# Patient Record
Sex: Male | Born: 1945 | Race: White | Hispanic: No | Marital: Married | Smoking: Never smoker
Health system: Southern US, Community
[De-identification: ages and names within clinical notes are randomized; demographics above are authoritative.]

## PROBLEM LIST (undated history)

## (undated) DIAGNOSIS — C801 Malignant (primary) neoplasm, unspecified: Secondary | ICD-10-CM

## (undated) DIAGNOSIS — E7800 Pure hypercholesterolemia, unspecified: Secondary | ICD-10-CM

## (undated) DIAGNOSIS — M4306 Spondylolysis, lumbar region: Secondary | ICD-10-CM

## (undated) DIAGNOSIS — R0602 Shortness of breath: Secondary | ICD-10-CM

## (undated) DIAGNOSIS — I517 Cardiomegaly: Secondary | ICD-10-CM

## (undated) HISTORY — DX: Cardiomegaly: I51.7

## (undated) HISTORY — DX: Spondylolysis, lumbar region: M43.06

## (undated) HISTORY — PX: OTHER SURGICAL HISTORY: SHX169

## (undated) HISTORY — DX: Pure hypercholesterolemia, unspecified: E78.00

---

## 2000-10-13 ENCOUNTER — Encounter: Payer: Self-pay | Admitting: Internal Medicine

## 2000-10-13 ENCOUNTER — Encounter: Admission: RE | Admit: 2000-10-13 | Discharge: 2000-10-13 | Payer: Self-pay | Admitting: Internal Medicine

## 2005-06-21 ENCOUNTER — Ambulatory Visit: Payer: Self-pay | Admitting: Internal Medicine

## 2005-07-02 ENCOUNTER — Ambulatory Visit: Payer: Self-pay | Admitting: Internal Medicine

## 2006-05-16 ENCOUNTER — Encounter: Payer: Self-pay | Admitting: Cardiology

## 2009-09-22 ENCOUNTER — Encounter: Admission: RE | Admit: 2009-09-22 | Discharge: 2009-09-22 | Payer: Self-pay | Admitting: Internal Medicine

## 2009-09-26 ENCOUNTER — Ambulatory Visit: Payer: Self-pay | Admitting: Oncology

## 2009-10-01 ENCOUNTER — Ambulatory Visit (HOSPITAL_COMMUNITY): Admission: RE | Admit: 2009-10-01 | Discharge: 2009-10-01 | Payer: Self-pay | Admitting: Oncology

## 2009-10-01 LAB — CBC WITH DIFFERENTIAL/PLATELET
BASO%: 0.5 % (ref 0.0–2.0)
Eosinophils Absolute: 0.1 10*3/uL (ref 0.0–0.5)
MCHC: 34.2 g/dL (ref 32.0–36.0)
MCV: 96.5 fL (ref 79.3–98.0)
MONO%: 7.2 % (ref 0.0–14.0)
NEUT#: 4.7 10*3/uL (ref 1.5–6.5)
RBC: 4.87 10*6/uL (ref 4.20–5.82)
RDW: 13.3 % (ref 11.0–14.6)
WBC: 7.3 10*3/uL (ref 4.0–10.3)

## 2009-10-03 LAB — SPEP & IFE WITH QIG
Albumin ELP: 58.8 % (ref 55.8–66.1)
Alpha-1-Globulin: 4.2 % (ref 2.9–4.9)
IgA: 161 mg/dL (ref 68–378)
IgM, Serum: 70 mg/dL (ref 60–263)
Total Protein, Serum Electrophoresis: 7 g/dL (ref 6.0–8.3)

## 2009-10-03 LAB — COMPREHENSIVE METABOLIC PANEL
ALT: 21 U/L (ref 0–53)
AST: 21 U/L (ref 0–37)
Albumin: 4.4 g/dL (ref 3.5–5.2)
Alkaline Phosphatase: 130 U/L — ABNORMAL HIGH (ref 39–117)
Glucose, Bld: 101 mg/dL — ABNORMAL HIGH (ref 70–99)
Potassium: 5.1 mEq/L (ref 3.5–5.3)
Sodium: 138 mEq/L (ref 135–145)
Total Bilirubin: 0.4 mg/dL (ref 0.3–1.2)
Total Protein: 7 g/dL (ref 6.0–8.3)

## 2009-10-17 ENCOUNTER — Ambulatory Visit (HOSPITAL_COMMUNITY): Admission: RE | Admit: 2009-10-17 | Discharge: 2009-10-17 | Payer: Self-pay | Admitting: Oncology

## 2009-10-30 ENCOUNTER — Ambulatory Visit (HOSPITAL_COMMUNITY): Admission: RE | Admit: 2009-10-30 | Discharge: 2009-10-30 | Payer: Self-pay | Admitting: Oncology

## 2009-10-31 HISTORY — PX: PORTACATH PLACEMENT: SHX2246

## 2009-11-12 ENCOUNTER — Ambulatory Visit: Payer: Self-pay | Admitting: Hematology & Oncology

## 2009-11-27 ENCOUNTER — Ambulatory Visit (HOSPITAL_COMMUNITY): Admission: RE | Admit: 2009-11-27 | Discharge: 2009-11-27 | Payer: Self-pay | Admitting: Hematology & Oncology

## 2009-12-10 LAB — CBC WITH DIFFERENTIAL (CANCER CENTER ONLY)
BASO%: 1.4 % (ref 0.0–2.0)
EOS%: 2.8 % (ref 0.0–7.0)
HGB: 15.4 g/dL (ref 13.0–17.1)
LYMPH#: 2.1 10*3/uL (ref 0.9–3.3)
MCHC: 34 g/dL (ref 32.0–35.9)
NEUT#: 0.7 10*3/uL — ABNORMAL LOW (ref 1.5–6.5)
RDW: 10.8 % (ref 10.5–14.6)

## 2009-12-17 ENCOUNTER — Ambulatory Visit: Payer: Self-pay | Admitting: Hematology & Oncology

## 2009-12-18 LAB — CBC WITH DIFFERENTIAL (CANCER CENTER ONLY)
BASO#: 0.1 10*3/uL (ref 0.0–0.2)
Eosinophils Absolute: 0.1 10*3/uL (ref 0.0–0.5)
HGB: 15.2 g/dL (ref 13.0–17.1)
LYMPH#: 2.3 10*3/uL (ref 0.9–3.3)
NEUT#: 2.6 10*3/uL (ref 1.5–6.5)
RBC: 4.58 10*6/uL (ref 4.20–5.70)
WBC: 5.6 10*3/uL (ref 4.0–10.0)

## 2009-12-18 LAB — COMPREHENSIVE METABOLIC PANEL
AST: 23 U/L (ref 0–37)
Albumin: 4 g/dL (ref 3.5–5.2)
BUN: 13 mg/dL (ref 6–23)
Calcium: 8.9 mg/dL (ref 8.4–10.5)
Chloride: 102 mEq/L (ref 96–112)
Glucose, Bld: 187 mg/dL — ABNORMAL HIGH (ref 70–99)
Potassium: 4.5 mEq/L (ref 3.5–5.3)
Total Protein: 6.5 g/dL (ref 6.0–8.3)

## 2010-01-06 ENCOUNTER — Ambulatory Visit (HOSPITAL_COMMUNITY): Admission: RE | Admit: 2010-01-06 | Discharge: 2010-01-06 | Payer: Self-pay | Admitting: Hematology & Oncology

## 2010-01-08 LAB — CBC WITH DIFFERENTIAL (CANCER CENTER ONLY)
BASO#: 0 10*3/uL (ref 0.0–0.2)
EOS%: 1.2 % (ref 0.0–7.0)
HCT: 41.3 % (ref 38.7–49.9)
HGB: 14 g/dL (ref 13.0–17.1)
MCH: 32.9 pg (ref 28.0–33.4)
MCHC: 33.8 g/dL (ref 32.0–35.9)
MONO%: 10.9 % (ref 0.0–13.0)
NEUT%: 49.8 % (ref 40.0–80.0)
RDW: 11.1 % (ref 10.5–14.6)

## 2010-01-08 LAB — COMPREHENSIVE METABOLIC PANEL
AST: 22 U/L (ref 0–37)
Alkaline Phosphatase: 86 U/L (ref 39–117)
BUN: 19 mg/dL (ref 6–23)
Creatinine, Ser: 0.71 mg/dL (ref 0.40–1.50)
Total Bilirubin: 0.3 mg/dL (ref 0.3–1.2)

## 2010-01-27 ENCOUNTER — Ambulatory Visit: Payer: Self-pay | Admitting: Hematology & Oncology

## 2010-01-29 LAB — CBC WITH DIFFERENTIAL (CANCER CENTER ONLY)
BASO#: 0.1 10*3/uL (ref 0.0–0.2)
Eosinophils Absolute: 0.2 10*3/uL (ref 0.0–0.5)
HCT: 39.8 % (ref 38.7–49.9)
HGB: 13.6 g/dL (ref 13.0–17.1)
LYMPH%: 21.6 % (ref 14.0–48.0)
MCH: 33.3 pg (ref 28.0–33.4)
MCV: 97 fL (ref 82–98)
MONO%: 14 % — ABNORMAL HIGH (ref 0.0–13.0)
NEUT#: 4.5 10*3/uL (ref 1.5–6.5)
NEUT%: 61.3 % (ref 40.0–80.0)
RBC: 4.09 10*6/uL — ABNORMAL LOW (ref 4.20–5.70)

## 2010-01-29 LAB — COMPREHENSIVE METABOLIC PANEL
Albumin: 4 g/dL (ref 3.5–5.2)
Alkaline Phosphatase: 107 U/L (ref 39–117)
BUN: 10 mg/dL (ref 6–23)
Calcium: 9.1 mg/dL (ref 8.4–10.5)
Glucose, Bld: 178 mg/dL — ABNORMAL HIGH (ref 70–99)
Potassium: 4.7 mEq/L (ref 3.5–5.3)

## 2010-01-29 LAB — WHOLE BLOOD GLUCOSE - CHCC SATELLITE
Glucose: 184 mg/dL — ABNORMAL HIGH (ref 70–100)
HRS PC: 8 Hours

## 2010-02-17 ENCOUNTER — Ambulatory Visit (HOSPITAL_COMMUNITY): Admission: RE | Admit: 2010-02-17 | Discharge: 2010-02-17 | Payer: Self-pay | Admitting: Hematology & Oncology

## 2010-02-19 LAB — CBC WITH DIFFERENTIAL (CANCER CENTER ONLY)
BASO#: 0.1 10*3/uL (ref 0.0–0.2)
BASO%: 1.1 % (ref 0.0–2.0)
EOS%: 1 % (ref 0.0–7.0)
Eosinophils Absolute: 0 10*3/uL (ref 0.0–0.5)
HCT: 37.6 % — ABNORMAL LOW (ref 38.7–49.9)
HGB: 12.9 g/dL — ABNORMAL LOW (ref 13.0–17.1)
LYMPH#: 1.5 10*3/uL (ref 0.9–3.3)
LYMPH%: 35.2 % (ref 14.0–48.0)
MCH: 34.1 pg — ABNORMAL HIGH (ref 28.0–33.4)
MCHC: 34.2 g/dL (ref 32.0–35.9)
MCV: 100 fL — ABNORMAL HIGH (ref 82–98)
MONO#: 0.8 10*3/uL (ref 0.1–0.9)
MONO%: 19.2 % — ABNORMAL HIGH (ref 0.0–13.0)
NEUT#: 1.8 10*3/uL (ref 1.5–6.5)
NEUT%: 43.5 % (ref 40.0–80.0)
Platelets: 218 10*3/uL (ref 145–400)
RBC: 3.77 10*6/uL — ABNORMAL LOW (ref 4.20–5.70)
RDW: 12.6 % (ref 10.5–14.6)
WBC: 4.2 10*3/uL (ref 4.0–10.0)

## 2010-02-19 LAB — COMPREHENSIVE METABOLIC PANEL
ALT: 36 U/L (ref 0–53)
AST: 25 U/L (ref 0–37)
Albumin: 3.9 g/dL (ref 3.5–5.2)
Alkaline Phosphatase: 87 U/L (ref 39–117)
BUN: 12 mg/dL (ref 6–23)
CO2: 23 mEq/L (ref 19–32)
Calcium: 9 mg/dL (ref 8.4–10.5)
Chloride: 103 mEq/L (ref 96–112)
Creatinine, Ser: 0.62 mg/dL (ref 0.40–1.50)
Glucose, Bld: 125 mg/dL — ABNORMAL HIGH (ref 70–99)
Potassium: 4.1 mEq/L (ref 3.5–5.3)
Sodium: 141 mEq/L (ref 135–145)
Total Bilirubin: 0.3 mg/dL (ref 0.3–1.2)
Total Protein: 6.2 g/dL (ref 6.0–8.3)

## 2010-02-19 LAB — WHOLE BLOOD GLUCOSE - CHCC SATELLITE
Glucose: 142 mg/dL — ABNORMAL HIGH (ref 70–100)
HRS PC: 8 Hours

## 2010-02-19 LAB — LACTATE DEHYDROGENASE: LDH: 202 U/L (ref 94–250)

## 2010-03-10 ENCOUNTER — Ambulatory Visit: Payer: Self-pay | Admitting: Hematology & Oncology

## 2010-03-12 LAB — CBC WITH DIFFERENTIAL (CANCER CENTER ONLY)
BASO%: 1 % (ref 0.0–2.0)
EOS%: 1.4 % (ref 0.0–7.0)
HGB: 12.9 g/dL — ABNORMAL LOW (ref 13.0–17.1)
LYMPH#: 1.4 10*3/uL (ref 0.9–3.3)
MCH: 34 pg — ABNORMAL HIGH (ref 28.0–33.4)
MCHC: 33.7 g/dL (ref 32.0–35.9)
MONO%: 18.2 % — ABNORMAL HIGH (ref 0.0–13.0)
NEUT#: 2 10*3/uL (ref 1.5–6.5)
Platelets: 198 10*3/uL (ref 145–400)

## 2010-03-12 LAB — COMPREHENSIVE METABOLIC PANEL
ALT: 56 U/L — ABNORMAL HIGH (ref 0–53)
AST: 36 U/L (ref 0–37)
Albumin: 4.1 g/dL (ref 3.5–5.2)
Alkaline Phosphatase: 83 U/L (ref 39–117)
Glucose, Bld: 135 mg/dL — ABNORMAL HIGH (ref 70–99)
Potassium: 4.1 mEq/L (ref 3.5–5.3)
Sodium: 139 mEq/L (ref 135–145)
Total Bilirubin: 0.4 mg/dL (ref 0.3–1.2)
Total Protein: 6.3 g/dL (ref 6.0–8.3)

## 2010-03-12 LAB — TECHNOLOGIST REVIEW CHCC SATELLITE

## 2010-03-31 ENCOUNTER — Ambulatory Visit (HOSPITAL_COMMUNITY)
Admission: RE | Admit: 2010-03-31 | Discharge: 2010-03-31 | Payer: Self-pay | Source: Home / Self Care | Admitting: Hematology & Oncology

## 2010-04-15 ENCOUNTER — Ambulatory Visit: Payer: Self-pay | Admitting: Hematology & Oncology

## 2010-04-16 LAB — CBC WITH DIFFERENTIAL (CANCER CENTER ONLY)
BASO#: 0 10*3/uL (ref 0.0–0.2)
BASO%: 0.7 % (ref 0.0–2.0)
EOS%: 2.3 % (ref 0.0–7.0)
HCT: 39.7 % (ref 38.7–49.9)
HGB: 13.4 g/dL (ref 13.0–17.1)
LYMPH#: 1.8 10*3/uL (ref 0.9–3.3)
MCHC: 33.7 g/dL (ref 32.0–35.9)
MONO#: 0.9 10*3/uL (ref 0.1–0.9)
NEUT#: 1.1 10*3/uL — ABNORMAL LOW (ref 1.5–6.5)
NEUT%: 27.9 % — ABNORMAL LOW (ref 40.0–80.0)
WBC: 3.8 10*3/uL — ABNORMAL LOW (ref 4.0–10.0)

## 2010-04-16 LAB — COMPREHENSIVE METABOLIC PANEL
ALT: 42 U/L (ref 0–53)
AST: 26 U/L (ref 0–37)
Albumin: 4.3 g/dL (ref 3.5–5.2)
BUN: 17 mg/dL (ref 6–23)
CO2: 27 mEq/L (ref 19–32)
Calcium: 9 mg/dL (ref 8.4–10.5)
Chloride: 102 mEq/L (ref 96–112)
Creatinine, Ser: 0.66 mg/dL (ref 0.40–1.50)
Potassium: 4.3 mEq/L (ref 3.5–5.3)

## 2010-04-16 LAB — LACTATE DEHYDROGENASE: LDH: 186 U/L (ref 94–250)

## 2010-05-05 ENCOUNTER — Ambulatory Visit (HOSPITAL_COMMUNITY)
Admission: RE | Admit: 2010-05-05 | Discharge: 2010-05-05 | Payer: Self-pay | Source: Home / Self Care | Attending: Hematology & Oncology | Admitting: Hematology & Oncology

## 2010-05-11 ENCOUNTER — Inpatient Hospital Stay (HOSPITAL_COMMUNITY)
Admission: AD | Admit: 2010-05-11 | Discharge: 2010-05-15 | Payer: Self-pay | Source: Home / Self Care | Attending: Hematology & Oncology | Admitting: Hematology & Oncology

## 2010-05-11 DIAGNOSIS — C8589 Other specified types of non-Hodgkin lymphoma, extranodal and solid organ sites: Secondary | ICD-10-CM

## 2010-05-18 LAB — URINALYSIS, MICROSCOPIC ONLY
Bilirubin Urine: NEGATIVE
Bilirubin Urine: NEGATIVE
Bilirubin Urine: NEGATIVE
Bilirubin Urine: NEGATIVE
Bilirubin Urine: NEGATIVE
Hgb urine dipstick: NEGATIVE
Hgb urine dipstick: NEGATIVE
Hgb urine dipstick: NEGATIVE
Hgb urine dipstick: NEGATIVE
Hgb urine dipstick: NEGATIVE
Ketones, ur: NEGATIVE mg/dL
Ketones, ur: NEGATIVE mg/dL
Ketones, ur: NEGATIVE mg/dL
Ketones, ur: NEGATIVE mg/dL
Ketones, ur: NEGATIVE mg/dL
Leukocytes, UA: NEGATIVE
Leukocytes, UA: NEGATIVE
Leukocytes, UA: NEGATIVE
Leukocytes, UA: NEGATIVE
Leukocytes, UA: NEGATIVE
Nitrite: NEGATIVE
Nitrite: NEGATIVE
Nitrite: NEGATIVE
Nitrite: NEGATIVE
Nitrite: NEGATIVE
Protein, ur: NEGATIVE mg/dL
Protein, ur: NEGATIVE mg/dL
Protein, ur: NEGATIVE mg/dL
Protein, ur: NEGATIVE mg/dL
Protein, ur: NEGATIVE mg/dL
Specific Gravity, Urine: 1.008 (ref 1.005–1.030)
Specific Gravity, Urine: 1.019 (ref 1.005–1.030)
Specific Gravity, Urine: 1.017 (ref 1.005–1.030)
Specific Gravity, Urine: 1.013 (ref 1.005–1.030)
Specific Gravity, Urine: 1.01 (ref 1.005–1.030)
Urine Glucose, Fasting: 100 mg/dL — AB
Urine Glucose, Fasting: >1000 mg/dL — AB
Urine Glucose, Fasting: >1000 mg/dL — AB
Urine Glucose, Fasting: 500 mg/dL — AB
Urine Glucose, Fasting: NEGATIVE mg/dL
Urine-Other: NONE SEEN
Urobilinogen, UA: 0.2 mg/dL (ref 0.0–1.0)
Urobilinogen, UA: 0.2 mg/dL (ref 0.0–1.0)
Urobilinogen, UA: 0.2 mg/dL (ref 0.0–1.0)
Urobilinogen, UA: 0.2 mg/dL (ref 0.0–1.0)
Urobilinogen, UA: 1 mg/dL (ref 0.0–1.0)
pH: 7.5 (ref 5.0–8.0)
pH: 8 (ref 5.0–8.0)
pH: 8 (ref 5.0–8.0)
pH: 7.5 (ref 5.0–8.0)
pH: 7.5 (ref 5.0–8.0)

## 2010-05-18 LAB — BASIC METABOLIC PANEL
BUN: 10 mg/dL (ref 6–23)
BUN: 9 mg/dL (ref 6–23)
CO2: 32 mEq/L (ref 19–32)
CO2: 33 mEq/L — ABNORMAL HIGH (ref 19–32)
Calcium: 8.5 mg/dL (ref 8.4–10.5)
Calcium: 8.5 mg/dL (ref 8.4–10.5)
Chloride: 100 mEq/L (ref 96–112)
Chloride: 98 mEq/L (ref 96–112)
Creatinine, Ser: 0.7 mg/dL (ref 0.4–1.5)
Creatinine, Ser: 0.68 mg/dL (ref 0.4–1.5)
GFR calc Af Amer: >60 mL/min (ref >60)
GFR calc Af Amer: >60 mL/min (ref >60)
GFR calc non Af Amer: >60 mL/min (ref >60)
GFR calc non Af Amer: >60 mL/min (ref >60)
Glucose, Bld: 175 mg/dL — ABNORMAL HIGH (ref 70–99)
Glucose, Bld: 185 mg/dL — ABNORMAL HIGH (ref 70–99)
Potassium: 3.3 mEq/L — ABNORMAL LOW (ref 3.5–5.1)
Potassium: 3.3 mEq/L — ABNORMAL LOW (ref 3.5–5.1)
Sodium: 140 mEq/L (ref 135–145)
Sodium: 140 mEq/L (ref 135–145)

## 2010-05-18 LAB — CBC
HCT: 37 % — ABNORMAL LOW (ref 39.0–52.0)
HCT: 37.1 % — ABNORMAL LOW (ref 39.0–52.0)
HCT: 35.6 % — ABNORMAL LOW (ref 39.0–52.0)
HCT: 35.6 % — ABNORMAL LOW (ref 39.0–52.0)
Hemoglobin: 12.8 g/dL — ABNORMAL LOW (ref 13.0–17.0)
Hemoglobin: 12.6 g/dL — ABNORMAL LOW (ref 13.0–17.0)
Hemoglobin: 12.3 g/dL — ABNORMAL LOW (ref 13.0–17.0)
Hemoglobin: 12.3 g/dL — ABNORMAL LOW (ref 13.0–17.0)
MCH: 33.9 pg (ref 26.0–34.0)
MCH: 33.1 pg (ref 26.0–34.0)
MCH: 33.2 pg (ref 26.0–34.0)
MCH: 33.2 pg (ref 26.0–34.0)
MCHC: 34.6 g/dL (ref 30.0–36.0)
MCHC: 34 g/dL (ref 30.0–36.0)
MCHC: 34.6 g/dL (ref 30.0–36.0)
MCHC: 34.6 g/dL (ref 30.0–36.0)
MCV: 97.9 fL (ref 78.0–100.0)
MCV: 97.4 fL (ref 78.0–100.0)
MCV: 96 fL (ref 78.0–100.0)
MCV: 96.2 fL (ref 78.0–100.0)
Platelets: 164 10*3/uL (ref 150–400)
Platelets: 149 10*3/uL — ABNORMAL LOW (ref 150–400)
Platelets: 155 10*3/uL (ref 150–400)
Platelets: 141 10*3/uL — ABNORMAL LOW (ref 150–400)
RBC: 3.78 MIL/uL — ABNORMAL LOW (ref 4.22–5.81)
RBC: 3.81 MIL/uL — ABNORMAL LOW (ref 4.22–5.81)
RBC: 3.71 MIL/uL — ABNORMAL LOW (ref 4.22–5.81)
RBC: 3.7 MIL/uL — ABNORMAL LOW (ref 4.22–5.81)
RDW: 12.8 % (ref 11.5–15.5)
RDW: 12.8 % (ref 11.5–15.5)
RDW: 12.6 % (ref 11.5–15.5)
RDW: 12.7 % (ref 11.5–15.5)
WBC: 3.5 10*3/uL — ABNORMAL LOW (ref 4.0–10.5)
WBC: 3.3 10*3/uL — ABNORMAL LOW (ref 4.0–10.5)
WBC: 5 10*3/uL (ref 4.0–10.5)
WBC: 4.1 10*3/uL (ref 4.0–10.5)

## 2010-05-18 LAB — URINALYSIS, ROUTINE W REFLEX MICROSCOPIC
Bilirubin Urine: NEGATIVE
Bilirubin Urine: NEGATIVE
Bilirubin Urine: NEGATIVE
Bilirubin Urine: NEGATIVE
Bilirubin Urine: NEGATIVE
Bilirubin Urine: NEGATIVE
Bilirubin Urine: NEGATIVE
Bilirubin Urine: NEGATIVE
Hgb urine dipstick: NEGATIVE
Hgb urine dipstick: NEGATIVE
Hgb urine dipstick: NEGATIVE
Hgb urine dipstick: NEGATIVE
Hgb urine dipstick: NEGATIVE
Hgb urine dipstick: NEGATIVE
Hgb urine dipstick: NEGATIVE
Hgb urine dipstick: NEGATIVE
Ketones, ur: NEGATIVE mg/dL
Ketones, ur: NEGATIVE mg/dL
Ketones, ur: NEGATIVE mg/dL
Ketones, ur: NEGATIVE mg/dL
Ketones, ur: NEGATIVE mg/dL
Ketones, ur: NEGATIVE mg/dL
Ketones, ur: NEGATIVE mg/dL
Ketones, ur: NEGATIVE mg/dL
Leukocytes, UA: NEGATIVE
Leukocytes, UA: NEGATIVE
Nitrite: NEGATIVE
Nitrite: NEGATIVE
Nitrite: NEGATIVE
Nitrite: NEGATIVE
Nitrite: NEGATIVE
Nitrite: NEGATIVE
Nitrite: NEGATIVE
Nitrite: NEGATIVE
Protein, ur: NEGATIVE mg/dL
Protein, ur: NEGATIVE mg/dL
Protein, ur: NEGATIVE mg/dL
Protein, ur: NEGATIVE mg/dL
Protein, ur: NEGATIVE mg/dL
Protein, ur: NEGATIVE mg/dL
Protein, ur: NEGATIVE mg/dL
Protein, ur: NEGATIVE mg/dL
Specific Gravity, Urine: 1.026 (ref 1.005–1.030)
Specific Gravity, Urine: 1.021 (ref 1.005–1.030)
Specific Gravity, Urine: 1.02 (ref 1.005–1.030)
Specific Gravity, Urine: 1.013 (ref 1.005–1.030)
Specific Gravity, Urine: 1.021 (ref 1.005–1.030)
Specific Gravity, Urine: 1.023 (ref 1.005–1.030)
Specific Gravity, Urine: 1.023 (ref 1.005–1.030)
Specific Gravity, Urine: 1.009 (ref 1.005–1.030)
Urine Glucose, Fasting: 250 mg/dL — AB
Urine Glucose, Fasting: NEGATIVE mg/dL
Urine Glucose, Fasting: NEGATIVE mg/dL
Urine Glucose, Fasting: 100 mg/dL — AB
Urine Glucose, Fasting: 100 mg/dL — AB
Urine Glucose, Fasting: >1000 mg/dL — AB
Urine Glucose, Fasting: >1000 mg/dL — AB
Urine Glucose, Fasting: 500 mg/dL — AB
Urobilinogen, UA: 0.2 mg/dL (ref 0.0–1.0)
Urobilinogen, UA: 0.2 mg/dL (ref 0.0–1.0)
Urobilinogen, UA: 0.2 mg/dL (ref 0.0–1.0)
Urobilinogen, UA: 0.2 mg/dL (ref 0.0–1.0)
Urobilinogen, UA: 0.2 mg/dL (ref 0.0–1.0)
Urobilinogen, UA: 0.2 mg/dL (ref 0.0–1.0)
Urobilinogen, UA: 0.2 mg/dL (ref 0.0–1.0)
Urobilinogen, UA: 0.2 mg/dL (ref 0.0–1.0)
pH: 6.5 (ref 5.0–8.0)
pH: 5.5 (ref 5.0–8.0)
pH: 6 (ref 5.0–8.0)
pH: 8.5 — ABNORMAL HIGH (ref 5.0–8.0)
pH: 8 (ref 5.0–8.0)
pH: 8 (ref 5.0–8.0)
pH: 8.5 — ABNORMAL HIGH (ref 5.0–8.0)
pH: 8 (ref 5.0–8.0)

## 2010-05-18 LAB — DIFFERENTIAL
Basophils Absolute: 0 10*3/uL (ref 0.0–0.1)
Basophils Absolute: 0 10*3/uL (ref 0.0–0.1)
Basophils Relative: 1 % (ref 0–1)
Basophils Relative: 1 % (ref 0–1)
Eosinophils Absolute: 0.1 10*3/uL (ref 0.0–0.7)
Eosinophils Absolute: 0.1 10*3/uL (ref 0.0–0.7)
Eosinophils Relative: 2 % (ref 0–5)
Eosinophils Relative: 3 % (ref 0–5)
Lymphocytes Relative: 52 % — ABNORMAL HIGH (ref 12–46)
Lymphocytes Relative: 45 % (ref 12–46)
Lymphs Abs: 1.8 10*3/uL (ref 0.7–4.0)
Lymphs Abs: 1.5 10*3/uL (ref 0.7–4.0)
Monocytes Absolute: 0.5 10*3/uL (ref 0.1–1.0)
Monocytes Absolute: 0.6 10*3/uL (ref 0.1–1.0)
Monocytes Relative: 15 % — ABNORMAL HIGH (ref 3–12)
Monocytes Relative: 18 % — ABNORMAL HIGH (ref 3–12)
Neutro Abs: 1.1 10*3/uL — ABNORMAL LOW (ref 1.7–7.7)
Neutro Abs: 1.1 10*3/uL — ABNORMAL LOW (ref 1.7–7.7)
Neutrophils Relative %: 31 % — ABNORMAL LOW (ref 43–77)
Neutrophils Relative %: 33 % — ABNORMAL LOW (ref 43–77)

## 2010-05-18 LAB — METHOTREXATE
Methotrexate: 0.97
Methotrexate: 0.08

## 2010-05-18 LAB — URINALYSIS, DIPSTICK ONLY
Bilirubin Urine: NEGATIVE
Bilirubin Urine: NEGATIVE
Bilirubin Urine: NEGATIVE
Hgb urine dipstick: NEGATIVE
Hgb urine dipstick: NEGATIVE
Hgb urine dipstick: NEGATIVE
Ketones, ur: NEGATIVE mg/dL
Ketones, ur: NEGATIVE mg/dL
Ketones, ur: NEGATIVE mg/dL
Leukocytes, UA: NEGATIVE
Leukocytes, UA: NEGATIVE
Leukocytes, UA: NEGATIVE
Nitrite: NEGATIVE
Nitrite: NEGATIVE
Nitrite: NEGATIVE
Protein, ur: NEGATIVE mg/dL
Protein, ur: NEGATIVE mg/dL
Protein, ur: NEGATIVE mg/dL
Specific Gravity, Urine: 1.023 (ref 1.005–1.030)
Specific Gravity, Urine: 1.009 (ref 1.005–1.030)
Specific Gravity, Urine: 1.009 (ref 1.005–1.030)
Urine Glucose, Fasting: >1000 mg/dL — AB
Urine Glucose, Fasting: NEGATIVE mg/dL
Urine Glucose, Fasting: 100 mg/dL — AB
Urobilinogen, UA: 0.2 mg/dL (ref 0.0–1.0)
Urobilinogen, UA: 0.2 mg/dL (ref 0.0–1.0)
Urobilinogen, UA: 0.2 mg/dL (ref 0.0–1.0)
pH: 7.5 (ref 5.0–8.0)
pH: 7.5 (ref 5.0–8.0)
pH: 8 (ref 5.0–8.0)

## 2010-05-18 LAB — COMPREHENSIVE METABOLIC PANEL
ALT: 36 U/L (ref 0–53)
AST: 33 U/L (ref 0–37)
Albumin: 3.3 g/dL — ABNORMAL LOW (ref 3.5–5.2)
Alkaline Phosphatase: 99 U/L (ref 39–117)
BUN: 11 mg/dL (ref 6–23)
CO2: 26 mEq/L (ref 19–32)
Calcium: 8.6 mg/dL (ref 8.4–10.5)
Chloride: 103 mEq/L (ref 96–112)
Creatinine, Ser: 0.73 mg/dL (ref 0.4–1.5)
GFR calc Af Amer: >60 mL/min (ref >60)
GFR calc non Af Amer: >60 mL/min (ref >60)
Glucose, Bld: 205 mg/dL — ABNORMAL HIGH (ref 70–99)
Potassium: 3.7 mEq/L (ref 3.5–5.1)
Sodium: 136 mEq/L (ref 135–145)
Total Bilirubin: 0.4 mg/dL (ref 0.3–1.2)
Total Protein: 5.7 g/dL — ABNORMAL LOW (ref 6.0–8.3)

## 2010-05-18 LAB — LACTATE DEHYDROGENASE
LDH: 159 U/L (ref 94–250)
LDH: 139 U/L (ref 94–250)

## 2010-05-18 LAB — URINE MICROSCOPIC-ADD ON

## 2010-05-24 ENCOUNTER — Encounter: Payer: Self-pay | Admitting: Oncology

## 2010-05-26 ENCOUNTER — Ambulatory Visit (HOSPITAL_BASED_OUTPATIENT_CLINIC_OR_DEPARTMENT_OTHER): Payer: BC Managed Care – PPO | Admitting: Hematology & Oncology

## 2010-05-28 LAB — CMP (CANCER CENTER ONLY)
ALT(SGPT): 49 U/L — ABNORMAL HIGH (ref 10–47)
AST: 32 U/L (ref 11–38)
Albumin: 3.6 g/dL (ref 3.3–5.5)
Alkaline Phosphatase: 123 U/L — ABNORMAL HIGH (ref 26–84)
BUN, Bld: 16 mg/dL (ref 7–22)
CO2: 27 mEq/L (ref 18–33)
Calcium: 9.1 mg/dL (ref 8.0–10.3)
Chloride: 100 mEq/L (ref 98–108)
Creat: 0.6 mg/dl (ref 0.6–1.2)
Glucose, Bld: 154 mg/dL — ABNORMAL HIGH (ref 73–118)
Potassium: 4.9 mEq/L — ABNORMAL HIGH (ref 3.3–4.7)
Sodium: 141 mEq/L (ref 128–145)
Total Bilirubin: 0.7 mg/dl (ref 0.20–1.60)
Total Protein: 6.9 g/dL (ref 6.4–8.1)

## 2010-05-28 LAB — CBC WITH DIFFERENTIAL (CANCER CENTER ONLY)
BASO#: 0 10*3/uL (ref 0.0–0.2)
BASO%: 0.7 % (ref 0.0–2.0)
EOS%: 3.2 % (ref 0.0–7.0)
Eosinophils Absolute: 0.1 10*3/uL (ref 0.0–0.5)
HCT: 43.1 % (ref 38.7–49.9)
HGB: 14.7 g/dL (ref 13.0–17.1)
LYMPH#: 1.3 10*3/uL (ref 0.9–3.3)
LYMPH%: 41 % (ref 14.0–48.0)
MCH: 33.6 pg — ABNORMAL HIGH (ref 28.0–33.4)
MCHC: 34 g/dL (ref 32.0–35.9)
MCV: 99 fL — ABNORMAL HIGH (ref 82–98)
MONO#: 0.5 10*3/uL (ref 0.1–0.9)
MONO%: 14.3 % — ABNORMAL HIGH (ref 0.0–13.0)
NEUT#: 1.3 10*3/uL — ABNORMAL LOW (ref 1.5–6.5)
NEUT%: 40.8 % (ref 40.0–80.0)
Platelets: 233 10*3/uL (ref 145–400)
RBC: 4.36 10*6/uL (ref 4.20–5.70)
RDW: 12 % (ref 10.5–14.6)
WBC: 3.2 10*3/uL — ABNORMAL LOW (ref 4.0–10.0)

## 2010-05-28 LAB — LACTATE DEHYDROGENASE: LDH: 193 U/L (ref 94–250)

## 2010-06-01 ENCOUNTER — Inpatient Hospital Stay (HOSPITAL_COMMUNITY)
Admission: AD | Admit: 2010-06-01 | Discharge: 2010-06-05 | DRG: 410 | Disposition: A | Discharge status: Home / Self Care | Payer: BC Managed Care – PPO | Attending: Hematology & Oncology | Admitting: Hematology & Oncology

## 2010-06-01 DIAGNOSIS — R945 Abnormal results of liver function studies: Secondary | ICD-10-CM | POA: Diagnosis present

## 2010-06-01 DIAGNOSIS — I10 Essential (primary) hypertension: Secondary | ICD-10-CM | POA: Diagnosis present

## 2010-06-01 DIAGNOSIS — C8589 Other specified types of non-Hodgkin lymphoma, extranodal and solid organ sites: Secondary | ICD-10-CM | POA: Diagnosis present

## 2010-06-01 DIAGNOSIS — E785 Hyperlipidemia, unspecified: Secondary | ICD-10-CM | POA: Diagnosis present

## 2010-06-01 DIAGNOSIS — Z5111 Encounter for antineoplastic chemotherapy: Principal | ICD-10-CM

## 2010-06-01 LAB — URINALYSIS, DIPSTICK ONLY
Bilirubin Urine: NEGATIVE
Ketones, ur: NEGATIVE mg/dL
Leukocytes, UA: NEGATIVE
Nitrite: NEGATIVE
Protein, ur: NEGATIVE mg/dL
Urobilinogen, UA: 0.2 mg/dL (ref 0.0–1.0)
pH: 6.5 (ref 5.0–8.0)

## 2010-06-01 LAB — CBC
HCT: 37.3 % — ABNORMAL LOW (ref 39.0–52.0)
Hemoglobin: 13 g/dL (ref 13.0–17.0)
MCH: 32.9 pg (ref 26.0–34.0)
MCHC: 34.9 g/dL (ref 30.0–36.0)
MCV: 94.4 fL (ref 78.0–100.0)
Platelets: 220 10*3/uL (ref 150–400)
RBC: 3.95 MIL/uL — ABNORMAL LOW (ref 4.22–5.81)
RDW: 13 % (ref 11.5–15.5)
WBC: 3.2 10*3/uL — ABNORMAL LOW (ref 4.0–10.5)

## 2010-06-01 LAB — COMPREHENSIVE METABOLIC PANEL
ALT: 46 U/L (ref 0–53)
AST: 26 U/L (ref 0–37)
Albumin: 3.3 g/dL — ABNORMAL LOW (ref 3.5–5.2)
Alkaline Phosphatase: 127 U/L — ABNORMAL HIGH (ref 39–117)
BUN: 13 mg/dL (ref 6–23)
CO2: 26 mEq/L (ref 19–32)
Calcium: 9.3 mg/dL (ref 8.4–10.5)
Chloride: 104 mEq/L (ref 96–112)
Creatinine, Ser: 0.76 mg/dL (ref 0.4–1.5)
GFR calc Af Amer: >60 mL/min (ref >60)
GFR calc non Af Amer: >60 mL/min (ref >60)
Glucose, Bld: 205 mg/dL — ABNORMAL HIGH (ref 70–99)
Potassium: 3.9 mEq/L (ref 3.5–5.1)
Sodium: 138 mEq/L (ref 135–145)
Total Bilirubin: 0.2 mg/dL — ABNORMAL LOW (ref 0.3–1.2)
Total Protein: 6.2 g/dL (ref 6.0–8.3)

## 2010-06-01 LAB — LACTATE DEHYDROGENASE: LDH: 137 U/L (ref 94–250)

## 2010-06-02 LAB — URINALYSIS, DIPSTICK ONLY
Bilirubin Urine: NEGATIVE
Bilirubin Urine: NEGATIVE
Bilirubin Urine: NEGATIVE
Hgb urine dipstick: NEGATIVE
Hgb urine dipstick: NEGATIVE
Hgb urine dipstick: NEGATIVE
Ketones, ur: NEGATIVE mg/dL
Ketones, ur: NEGATIVE mg/dL
Ketones, ur: NEGATIVE mg/dL
Ketones, ur: NEGATIVE mg/dL
Ketones, ur: NEGATIVE mg/dL
Leukocytes, UA: NEGATIVE
Leukocytes, UA: NEGATIVE
Nitrite: NEGATIVE
Nitrite: NEGATIVE
Nitrite: NEGATIVE
Protein, ur: 30 mg/dL — AB
Protein, ur: NEGATIVE mg/dL
Protein, ur: NEGATIVE mg/dL
Protein, ur: NEGATIVE mg/dL
Protein, ur: NEGATIVE mg/dL
Protein, ur: NEGATIVE mg/dL
Urine Glucose, Fasting: NEGATIVE mg/dL
Urine Glucose, Fasting: 250 mg/dL — AB
Urine Glucose, Fasting: >1000 mg/dL — AB
Urobilinogen, UA: 0.2 mg/dL (ref 0.0–1.0)
Urobilinogen, UA: 0.2 mg/dL (ref 0.0–1.0)
Urobilinogen, UA: 0.2 mg/dL (ref 0.0–1.0)
Urobilinogen, UA: 0.2 mg/dL (ref 0.0–1.0)
pH: 5.5 (ref 5.0–8.0)
pH: 8.5 — ABNORMAL HIGH (ref 5.0–8.0)

## 2010-06-02 LAB — GLUCOSE, CAPILLARY: Glucose-Capillary: 222 mg/dL — ABNORMAL HIGH (ref 70–99)

## 2010-06-03 LAB — URINALYSIS, DIPSTICK ONLY
Bilirubin Urine: NEGATIVE
Hgb urine dipstick: NEGATIVE
Hgb urine dipstick: NEGATIVE
Hgb urine dipstick: NEGATIVE
Ketones, ur: NEGATIVE mg/dL
Ketones, ur: NEGATIVE mg/dL
Leukocytes, UA: NEGATIVE
Nitrite: NEGATIVE
Protein, ur: NEGATIVE mg/dL
Protein, ur: NEGATIVE mg/dL
Protein, ur: NEGATIVE mg/dL
Protein, ur: NEGATIVE mg/dL
Urine Glucose, Fasting: NEGATIVE mg/dL
Urobilinogen, UA: 0.2 mg/dL (ref 0.0–1.0)
Urobilinogen, UA: 0.2 mg/dL (ref 0.0–1.0)
Urobilinogen, UA: 0.2 mg/dL (ref 0.0–1.0)
Urobilinogen, UA: 0.2 mg/dL (ref 0.0–1.0)

## 2010-06-03 LAB — BASIC METABOLIC PANEL
BUN: 12 mg/dL (ref 6–23)
CO2: 28 mEq/L (ref 19–32)
Calcium: 8.8 mg/dL (ref 8.4–10.5)
Creatinine, Ser: 0.75 mg/dL (ref 0.4–1.5)
Glucose, Bld: 164 mg/dL — ABNORMAL HIGH (ref 70–99)

## 2010-06-03 LAB — URINALYSIS, ROUTINE W REFLEX MICROSCOPIC
Hgb urine dipstick: NEGATIVE
Ketones, ur: NEGATIVE mg/dL
Leukocytes, UA: NEGATIVE
Protein, ur: NEGATIVE mg/dL
Urine Glucose, Fasting: >1000 mg/dL — AB

## 2010-06-03 LAB — METHOTREXATE: Methotrexate: 2.15

## 2010-06-04 LAB — URINALYSIS, DIPSTICK ONLY
Hgb urine dipstick: NEGATIVE
Hgb urine dipstick: NEGATIVE
Ketones, ur: NEGATIVE mg/dL
Ketones, ur: NEGATIVE mg/dL
Ketones, ur: NEGATIVE mg/dL
Leukocytes, UA: NEGATIVE
Leukocytes, UA: NEGATIVE
Leukocytes, UA: NEGATIVE
Nitrite: NEGATIVE
Protein, ur: NEGATIVE mg/dL
Protein, ur: NEGATIVE mg/dL
Protein, ur: NEGATIVE mg/dL
Urine Glucose, Fasting: 250 mg/dL — AB
Urobilinogen, UA: 0.2 mg/dL (ref 0.0–1.0)
pH: 7.5 (ref 5.0–8.0)

## 2010-06-04 LAB — BASIC METABOLIC PANEL
CO2: 29 mEq/L (ref 19–32)
Calcium: 8.9 mg/dL (ref 8.4–10.5)
Creatinine, Ser: 0.91 mg/dL (ref 0.4–1.5)
Glucose, Bld: 270 mg/dL — ABNORMAL HIGH (ref 70–99)

## 2010-06-04 LAB — CBC
HCT: 34.8 % — ABNORMAL LOW (ref 39.0–52.0)
Hemoglobin: 12.5 g/dL — ABNORMAL LOW (ref 13.0–17.0)
MCH: 33.3 pg (ref 26.0–34.0)
MCHC: 35.9 g/dL (ref 30.0–36.0)
MCV: 92.8 fL (ref 78.0–100.0)

## 2010-06-04 LAB — METHOTREXATE: Methotrexate: 0.07

## 2010-06-05 LAB — COMPREHENSIVE METABOLIC PANEL
ALT: 171 U/L — ABNORMAL HIGH (ref 0–53)
AST: 121 U/L — ABNORMAL HIGH (ref 0–37)
Alkaline Phosphatase: 82 U/L (ref 39–117)
CO2: 31 mEq/L (ref 19–32)
Calcium: 8.5 mg/dL (ref 8.4–10.5)
Chloride: 95 mEq/L — ABNORMAL LOW (ref 96–112)
GFR calc Af Amer: >60 mL/min (ref >60)
GFR calc non Af Amer: >60 mL/min (ref >60)
Glucose, Bld: 172 mg/dL — ABNORMAL HIGH (ref 70–99)
Sodium: 135 mEq/L (ref 135–145)
Total Bilirubin: 0.8 mg/dL (ref 0.3–1.2)

## 2010-06-05 LAB — URINALYSIS, DIPSTICK ONLY
Bilirubin Urine: NEGATIVE
Hgb urine dipstick: NEGATIVE
Leukocytes, UA: NEGATIVE
Nitrite: NEGATIVE
Protein, ur: NEGATIVE mg/dL
Specific Gravity, Urine: 1.01 (ref 1.005–1.030)
Specific Gravity, Urine: 1.018 (ref 1.005–1.030)
Urine Glucose, Fasting: 500 mg/dL — AB
Urobilinogen, UA: 0.2 mg/dL (ref 0.0–1.0)
Urobilinogen, UA: 0.2 mg/dL (ref 0.0–1.0)

## 2010-06-09 NOTE — Discharge Summary (Signed)
NAME:  Walter [name], Walter [name] NO.:  [id number]  MEDICAL RECORD NO.:  [id number]          PATIENT TYPE:  INP  LOCATION:  1306                         FACILITY:  [facility]  PHYSICIAN:  Rose Phi. Myna Hidalgo, M.D. DATE OF BIRTH:  [birth year]  DATE OF ADMISSION:  05/11/2010 DATE OF DISCHARGE:  05/15/2010                              DISCHARGE SUMMARY   DISCHARGE DIAGNOSES: 1. Chemotherapy for non-Hodgkin's lymphoma, central nervous system     prophylaxis. 2. Follicular large cell non-Hodgkin's lymphoma. 3. Hyperlipidemia.  CONDITION ON DISCHARGE:  Stable.  ACTIVITY:  As tolerated.  DIET:  No restrictions.  FOLLOWUP:  The patient will follow up with Dr. Myna Hidalgo as scheduled, I think on January 26.  MEDICATION ON DISCHARGE: 1. Pepcid 10 mg p.o. b.i.d. 2. MiraLax powder 17 grams p.o. daily as needed. 3. Allegra XR 180 mg p.o. daily as needed. 4. Voltaren 50 mg p.o. b.i.d. p.r.n. 5. Vitamin C 500 mg p.o. b.i.d. 6. Aspirin 81 mg p.o. daily. 7. Lipitor 20 mg p.o. daily. 8. Phenergan 12.5 mg p.o. q.6 hours p.r.n. nausea and vomiting.  HOSPITAL COURSE:  [name] was admitted to [facility].  He had a Port-A- Cath already placed.  He was started on IV fluids with sodium bicarbonate.  We made sure his urine was alkalinized appropriately.  His urine pH was over 7.5.  We then went ahead and started him on a high dose of methotrexate.  His body surface area was 2.2 m2.  He received a dose of methotrexate at 3 grams per meter squared.  His total dose was 7250 mg.  He received this IV over 4 hours.  He tolerated this very well.  We then started leucovorin at 15 mg IV every 6 hours about 16 hours after the start of his methotrexate infusion.  He had very few side effects.  His laboratory work all looked good.  His urine remained alkaline.  His initial methotrexate level was 0.97 micromoles.  This was on January 11.  On January 12, his serum methotrexate level was down to  0.08 micromoles.  As such, I felt that we could get him home on the morning of January 13.  Again, his urine pH maintained alkaline.  On January 13, his laboratory work showed a white cell count of 4.1, hemoglobin 12.3, hematocrit 35.6, platelet count 141.  On January 12, his LDH was normal at 139.  Also on January 12, his BUN and creatinine were 10 and 0.7. His sodium was 140, potassium 3.3.  His glucose was 175.  We did give him some diuretics with Lasix just to help promote urine output.  He maintained an excellent urine output.  On January 12, his urine output was 2000 cc.  He did have a little bit of a headache during the hospital stay.  This may have been from his Decadron that he had.  He remained afebrile during the hospital stay.  On the morning of discharge, all of his vital signs remained stable.  His temperature was 97.9, pulse 70, respiratory rate 18, blood pressure 136/83.  PHYSICAL EXAMINATION:  HEENT:  Upon discharge, physical examination showed a normocephalic,  atraumatic skull.  There was no scleral icterus. Pupils reacted appropriately. NECK:  There was no adenopathy.  He had no mucositis. LUNGS:  His lungs were clear bilaterally. CARDIAC:  Regular rate and rhythm with normal S1-S2.  There were no murmurs, rubs or bruits. ABDOMEN:  Soft with good bowel sounds.  No palpable abdominal mass. There was no fluid wave.  No hepatosplenomegaly. EXTREMITIES:  Showed no clubbing, cyanosis or edema. NEUROLOGIC:  Exam showed no focal neurological deficits.     Rose Phi. Myna Hidalgo, M.D.     PRE/MEDQ  D:  05/15/2010  T:  05/15/2010  Job:  [id number]  Electronically Signed by Arlan Organ  on 06/09/2010 06:56:07 AM

## 2010-06-09 NOTE — Discharge Summary (Signed)
NAME:  Walter [name], Walter [name] NO.:  [id number]  MEDICAL RECORD NO.:  [id number]          PATIENT TYPE:  INP  LOCATION:  1321                         FACILITY:  [facility]  PHYSICIAN:  Rose Phi. Myna Hidalgo, M.D. DATE OF BIRTH:  February 21, 1946  DATE OF ADMISSION:  06/01/2010 DATE OF DISCHARGE:  06/05/2010                              DISCHARGE SUMMARY   DIAGNOSES UPON DISCHARGE: 1. Status post cycle 2 high-dose methotrexate for central nervous     system prophylaxis. 2. History of non-Hodgkin lymphoma. 3. Hypertension. 4. Hyperlipidemia secondary to Decadron. 5. Transient elevation of liver function tests, possibly secondary to     preservative with methotrexate.  CONDITION ON DISCHARGE:  Stable.  ACTIVITY:  As tolerated.  DIET:  No added sugar.  FOLLOWUP:  Walter [name] will follow up with Dr. Myna Hidalgo at the [facility] in 2-3 weeks.  DISCHARGE MEDICATIONS: 1. Aspirin 81 mg p.o. daily. 2. MiraLax 17 g p.o. daily p.r.n. 3. Pepcid 10 mg p.o. b.i.d. 4. Phenergan 12.5 mg p.o. q.6h. p.r.n. 5. Vitamin C 1000 mg daily. 6. Allegra D 180 mg p.o. daily p.r.n. 7. Voltaren 50 mg p.o. q.12h. p.r.n. 8. Lipitor wore 20 mg p.o. daily - on hold.  HOSPITAL COURSE:  Walter [name] was admitted on June 01, 2010, for second cycle of high-dose methotrexate.  He tolerated his first cycle very well.  He had no problems with the first cycle of chemotherapy.  Unfortunately, there is a shortage of methotrexate.  We were not able to get him started immediately.  We had to wait until the hospital acquired enough methotrexate.  We finally were able to get methotrexate started on him.  He was started on IV fluid with initially D5W with 2 ampules of sodium bicarb.  His initial blood sugar was quite high.  When he was admitted, his blood sugar was 205.  His CBC showed a white cell count 3.2, hemoglobin 13, hematocrit 37.3, and platelet count 220,000.  LDH was 137.  We checked his  urine pH every 4 hours.  We did get his pH alkaline enough with level of 8.0, so we could start him on the high-dose methotrexate when he did come in.  His body surface area was 2.2 m2. His methotrexate dose was 3500 mg/m2.  His total dose was 7700 mg. According to the pharmacist, the methotrexate came with a preservative. This is the only type of methotrexate that they could find.  We moved ahead and treated him with this.  He got methotrexate over 4 hours.  He got antiemetics with Zofran and Decadron.  Again because of his hyperglycemia, we stopped his Decadron, discontinued with the Zofran.  I did give him some Lasix to make sure that we attained a good diuresis. He responded very nicely to Lasix with at least 3-4 L of urine put out daily.  All in all, his vital signs remained stable during the hospital stay. He had an excellent appetite with really no nausea or vomiting.  He was a little bit constipated.  This may have been from the Zofran.  His initial methotrexate level on June 03, 2010, was  2.15.  On June 04, 2010, his level was down to 0.07.  As such, I felt that we could get him home without any difficulties.  He continued to receive Leucovorin every 6 hours.  I think he received a total of 8 doses.  On the morning of discharge, his glucose was 172, his potassium was 3.4, and sodium 135.  His LFTs were slightly elevated.  Alkaline phosphatase was normal.  Total bilirubin was normal.  SGPT was 171, SGOT was 121. Again, this could have been from the methotrexate.  It could have been from the preservative that the methotrexate was mixed with.  He was symptomatic with this.  His BUN and creatinine were 16 and 0.8.  Calcium 8.51, albumin of 2.9.  He ambulated well during his hospital stay.  We let him go outside.  He was having no difficulties with ambulating.  On the morning of discharge, his temperature was 98.1, pulse 64, respiratory rate 12, blood pressure 137/81,  and ox saturation was 97%. His head and neck exam shows a normocephalic, atraumatic skull.  There is no scleral icterus.  He had no oral lesions.  Neck was supple with no adenopathy.  Lungs were clear to percussion and auscultation bilaterally.  Cardiac exam, regular rate and rhythm with normal S1-S2. There were no murmurs, rubs, or bruits.  Abdominal exam, soft, mildly obese.  He had good bowel sounds.  There is no guarding or rebound tenderness.  There is no palpable hepato megaly.  He had no splenomegaly.  Extremities show no clubbing, cyanosis, or edema. Neurological exam showed no focal neurological deficits.  Skin exam, no rashes, ecchymosis, or petechiae.     Rose Phi. Myna Hidalgo, M.D.     PRE/MEDQ  D:  06/05/2010  T:  06/05/2010  Job:  [id number]  Electronically Signed by Arlan Organ  on 06/09/2010 06:56:24 AM

## 2010-06-09 NOTE — H&P (Signed)
NAME:  Walter [name], Walter [name] NO.:  [id number]  MEDICAL RECORD NO.:  [id number]          PATIENT TYPE:  INP  LOCATION:  1306                         FACILITY:  [facility]  PHYSICIAN:  Rose Phi. Myna Hidalgo, M.D. DATE OF BIRTH:  02/11/1946  DATE OF ADMISSION:  05/11/2010 DATE OF DISCHARGE:                             HISTORY & PHYSICAL   REASON FOR ADMISSION:  Chemotherapy for non-Hodgkin's lymphoma.  HISTORY OF PRESENT ILLNESS:  Walter [name] is a 65 year old gentleman with non-Hodgkin's lymphoma.  He has been treated in the past with R-CHOP. He now comes in for chemotherapy for his CNS prophylaxis.  He is going to begin a high dose of methotrexate.  He is feeling well.  He is having no headache.  He had a recent spinal tap which did not show any evidence of lymphomatous cells within the CSF.  He has had no nausea or vomiting.  He does have occasional headache.  He has been working without any difficulty.  He has had no change in bowel or bladder habits.  PAST MEDICAL HISTORY:  His past medical history is remarkable for: 1. Hypertension. 2. Hyperlipidemia.  ALLERGIES:  NONE.  MEDICATIONS ON ADMISSION: 1. Pepcid 10 mg p.o. b.i.d. 2. MiraLax 17 grams p.o. daily. 3. Allegra 180 mg p.o. daily. 4. Voltaren 50 mg p.o. b.i.d. 5. Vitamin C 1000 mg p.o. daily. 6. Aspirin 81 mg p.o. daily. 7. Lipitor 20 mg p.o. daily.  SOCIAL HISTORY:  Negative for tobacco or alcohol use.  There is no occupational exposures.  FAMILY HISTORY:  Noncontributory.  PHYSICAL EXAMINATION:  GENERAL:  This is a well-developed, well- nourished white gentleman in no obvious distress. VITAL SIGNS:  Temperature 96.9, pulse 105, respiratory rate 18, blood pressure 125/89.  Weight is 225. HEENT:  Head exam shows a normocephalic, atraumatic skull.  He has good extraocular muscle movement.  Pupils react appropriately.  There is no mucositis. NECK:  There is no adenopathy. LUNGS:  Clear  bilaterally. CARDIAC:  Regular rate and rhythm with a normal S1-S2.  There are no murmurs, rubs or bruits. ABDOMEN:  Soft with good bowel sounds.  There is no palpable abdominal mass.  There is no fluid wave.  There is no palpable hepatosplenomegaly. EXTREMITIES:  Shows no clubbing, cyanosis or edema.  He has good range of motion of his joints. SKIN:  No rashes, ecchymosis or petechia. NEUROLOGIC:  Exam shows no focal neurological deficits.  LABORATORY DATA:  His laboratory work is pending.  IMPRESSION AND PLAN:  Walter [name] is a 65 year old gentleman with a history significant for large cell non-Hodgkin's lymphoma.  He is in remission right now.  We are treating him with high-dose methotrexate systemically for central nervous system prophylaxis.  We will start him on IV fluids with sodium bicarbonate.  We need to make sure his urine pH is alkaline and maintain a level above 7.5.  We will give him a dose of methotrexate 3.5 grams per meter squared.  We will infuse this over 4 hours.  We will plan for a leucovorin rescue to began about 16 hours after the methotrexate is completed.  We will continue to  monitor his urine pH every 4 hours.  I will be checking his laboratory work routinely.  We will give him deep vein thrombosis prophylaxis with Lovenox.  We will send a methotrexate level about 24 hours after the start of his infusion.     Rose Phi. Myna Hidalgo, M.D.     PRE/MEDQ  D:  05/14/2010  T:  05/14/2010  Job:  [id number]  Electronically Signed by Arlan Organ  on 06/09/2010 06:56:10 AM

## 2010-06-19 ENCOUNTER — Other Ambulatory Visit: Payer: Self-pay | Admitting: Hematology & Oncology

## 2010-06-19 ENCOUNTER — Encounter (HOSPITAL_BASED_OUTPATIENT_CLINIC_OR_DEPARTMENT_OTHER): Payer: BC Managed Care – PPO | Admitting: Hematology & Oncology

## 2010-06-19 DIAGNOSIS — C8590 Non-Hodgkin lymphoma, unspecified, unspecified site: Secondary | ICD-10-CM

## 2010-06-19 DIAGNOSIS — C8589 Other specified types of non-Hodgkin lymphoma, extranodal and solid organ sites: Secondary | ICD-10-CM

## 2010-06-19 LAB — CMP (CANCER CENTER ONLY)
ALT(SGPT): 43 U/L (ref 10–47)
Albumin: 3.8 g/dL (ref 3.3–5.5)
Alkaline Phosphatase: 117 U/L — ABNORMAL HIGH (ref 26–84)
CO2: 29 mEq/L (ref 18–33)
Potassium: 4.5 mEq/L (ref 3.3–4.7)
Sodium: 139 mEq/L (ref 128–145)
Total Bilirubin: 0.6 mg/dl (ref 0.20–1.60)
Total Protein: 7 g/dL (ref 6.4–8.1)

## 2010-06-19 LAB — CBC WITH DIFFERENTIAL (CANCER CENTER ONLY)
BASO%: 0.9 % (ref 0.0–2.0)
Eosinophils Absolute: 0.1 10*3/uL (ref 0.0–0.5)
LYMPH%: 57.3 % — ABNORMAL HIGH (ref 14.0–48.0)
MCV: 97 fL (ref 82–98)
MONO#: 0.5 10*3/uL (ref 0.1–0.9)
NEUT#: 0.6 10*3/uL — ABNORMAL LOW (ref 1.5–6.5)
Platelets: 263 10*3/uL (ref 145–400)
RBC: 4.41 10*6/uL (ref 4.20–5.70)
RDW: 12.2 % (ref 10.5–14.6)
WBC: 2.8 10*3/uL — ABNORMAL LOW (ref 4.0–10.0)

## 2010-06-19 LAB — LACTATE DEHYDROGENASE: LDH: 162 U/L (ref 94–250)

## 2010-06-29 ENCOUNTER — Inpatient Hospital Stay (HOSPITAL_COMMUNITY)
Admission: AD | Admit: 2010-06-29 | Discharge: 2010-07-02 | DRG: 410 | Disposition: A | Discharge status: Home / Self Care | Payer: BC Managed Care – PPO | Source: Ambulatory Visit | Attending: Hematology & Oncology | Admitting: Hematology & Oncology

## 2010-06-29 DIAGNOSIS — C8589 Other specified types of non-Hodgkin lymphoma, extranodal and solid organ sites: Secondary | ICD-10-CM

## 2010-06-29 DIAGNOSIS — Z5111 Encounter for antineoplastic chemotherapy: Principal | ICD-10-CM

## 2010-06-29 DIAGNOSIS — E785 Hyperlipidemia, unspecified: Secondary | ICD-10-CM | POA: Diagnosis present

## 2010-06-29 DIAGNOSIS — I10 Essential (primary) hypertension: Secondary | ICD-10-CM | POA: Diagnosis present

## 2010-06-29 LAB — URINALYSIS, DIPSTICK ONLY
Bilirubin Urine: NEGATIVE
Bilirubin Urine: NEGATIVE
Hgb urine dipstick: NEGATIVE
Hgb urine dipstick: NEGATIVE
Ketones, ur: NEGATIVE mg/dL
Ketones, ur: NEGATIVE mg/dL
Leukocytes, UA: NEGATIVE
Leukocytes, UA: NEGATIVE
Nitrite: NEGATIVE
Nitrite: NEGATIVE
Protein, ur: NEGATIVE mg/dL
Specific Gravity, Urine: 1.026 (ref 1.005–1.030)
Specific Gravity, Urine: 1.021 (ref 1.005–1.030)
Specific Gravity, Urine: 1.009 (ref 1.005–1.030)
Urine Glucose, Fasting: NEGATIVE mg/dL
Urine Glucose, Fasting: NEGATIVE mg/dL
Urobilinogen, UA: 0.2 mg/dL (ref 0.0–1.0)
Urobilinogen, UA: 0.2 mg/dL (ref 0.0–1.0)
Urobilinogen, UA: 0.2 mg/dL (ref 0.0–1.0)
pH: 5.5 (ref 5.0–8.0)
pH: 6 (ref 5.0–8.0)
pH: 7.5 (ref 5.0–8.0)

## 2010-06-29 LAB — COMPREHENSIVE METABOLIC PANEL
Alkaline Phosphatase: 117 U/L (ref 39–117)
BUN: 11 mg/dL (ref 6–23)
Creatinine, Ser: 0.73 mg/dL (ref 0.4–1.5)
Glucose, Bld: 119 mg/dL — ABNORMAL HIGH (ref 70–99)
Potassium: 3.9 mEq/L (ref 3.5–5.1)
Total Bilirubin: 0.5 mg/dL (ref 0.3–1.2)
Total Protein: 6 g/dL (ref 6.0–8.3)

## 2010-06-29 LAB — CBC
HCT: 39.2 % (ref 39.0–52.0)
Hemoglobin: 13.6 g/dL (ref 13.0–17.0)
MCH: 32.7 pg (ref 26.0–34.0)
MCV: 94.2 fL (ref 78.0–100.0)
RDW: 13.3 % (ref 11.5–15.5)
WBC: 3.7 10*3/uL — ABNORMAL LOW (ref 4.0–10.5)

## 2010-06-29 LAB — URIC ACID: Uric Acid, Serum: 4.6 mg/dL (ref 4.0–7.8)

## 2010-06-29 LAB — LACTATE DEHYDROGENASE: LDH: 152 U/L (ref 94–250)

## 2010-06-30 LAB — URINALYSIS, DIPSTICK ONLY
Bilirubin Urine: NEGATIVE
Bilirubin Urine: NEGATIVE
Bilirubin Urine: NEGATIVE
Bilirubin Urine: NEGATIVE
Bilirubin Urine: NEGATIVE
Hgb urine dipstick: NEGATIVE
Hgb urine dipstick: NEGATIVE
Hgb urine dipstick: NEGATIVE
Ketones, ur: NEGATIVE mg/dL
Ketones, ur: NEGATIVE mg/dL
Ketones, ur: NEGATIVE mg/dL
Nitrite: NEGATIVE
Protein, ur: NEGATIVE mg/dL
Protein, ur: 30 mg/dL — AB
Specific Gravity, Urine: 1.011 (ref 1.005–1.030)
Specific Gravity, Urine: 1.009 (ref 1.005–1.030)
Urine Glucose, Fasting: NEGATIVE mg/dL
Urine Glucose, Fasting: NEGATIVE mg/dL
Urobilinogen, UA: 0.2 mg/dL (ref 0.0–1.0)
pH: 7.5 (ref 5.0–8.0)
pH: 8.5 — ABNORMAL HIGH (ref 5.0–8.0)
pH: 7.5 (ref 5.0–8.0)

## 2010-07-01 LAB — URINALYSIS, DIPSTICK ONLY
Bilirubin Urine: NEGATIVE
Hgb urine dipstick: NEGATIVE
Ketones, ur: NEGATIVE mg/dL
Leukocytes, UA: NEGATIVE
Nitrite: NEGATIVE
Nitrite: NEGATIVE
Protein, ur: NEGATIVE mg/dL
Protein, ur: NEGATIVE mg/dL
Protein, ur: NEGATIVE mg/dL
Specific Gravity, Urine: 1.01 (ref 1.005–1.030)
Specific Gravity, Urine: 1.013 (ref 1.005–1.030)
Urine Glucose, Fasting: NEGATIVE mg/dL
Urobilinogen, UA: 0.2 mg/dL (ref 0.0–1.0)
Urobilinogen, UA: 0.2 mg/dL (ref 0.0–1.0)
Urobilinogen, UA: 0.2 mg/dL (ref 0.0–1.0)

## 2010-07-01 LAB — CBC
MCH: 32.8 pg (ref 26.0–34.0)
MCHC: 35 g/dL (ref 30.0–36.0)
MCV: 93.6 fL (ref 78.0–100.0)
Platelets: 155 10*3/uL (ref 150–400)
RDW: 13.4 % (ref 11.5–15.5)
WBC: 5.5 10*3/uL (ref 4.0–10.5)

## 2010-07-01 LAB — BASIC METABOLIC PANEL
BUN: 10 mg/dL (ref 6–23)
Calcium: 8.5 mg/dL (ref 8.4–10.5)
Creatinine, Ser: 0.8 mg/dL (ref 0.4–1.5)
GFR calc non Af Amer: >60 mL/min (ref >60)

## 2010-07-01 LAB — METHOTREXATE: Methotrexate: 1.35

## 2010-07-02 DIAGNOSIS — C8589 Other specified types of non-Hodgkin lymphoma, extranodal and solid organ sites: Secondary | ICD-10-CM

## 2010-07-02 LAB — CBC
Hemoglobin: 12.7 g/dL — ABNORMAL LOW (ref 13.0–17.0)
MCHC: 34.7 g/dL (ref 30.0–36.0)
Platelets: 136 10*3/uL — ABNORMAL LOW (ref 150–400)

## 2010-07-02 LAB — URINALYSIS, DIPSTICK ONLY
Bilirubin Urine: NEGATIVE
Hgb urine dipstick: NEGATIVE
Ketones, ur: NEGATIVE mg/dL
Ketones, ur: NEGATIVE mg/dL
Leukocytes, UA: NEGATIVE
Nitrite: NEGATIVE
Protein, ur: NEGATIVE mg/dL
Specific Gravity, Urine: 1.011 (ref 1.005–1.030)
Urine Glucose, Fasting: NEGATIVE mg/dL
Urobilinogen, UA: 0.2 mg/dL (ref 0.0–1.0)
Urobilinogen, UA: 0.2 mg/dL (ref 0.0–1.0)
pH: 7.5 (ref 5.0–8.0)

## 2010-07-02 LAB — COMPREHENSIVE METABOLIC PANEL
ALT: 66 U/L — ABNORMAL HIGH (ref 0–53)
AST: 44 U/L — ABNORMAL HIGH (ref 0–37)
Albumin: 3 g/dL — ABNORMAL LOW (ref 3.5–5.2)
CO2: 29 mEq/L (ref 19–32)
Calcium: 8.4 mg/dL (ref 8.4–10.5)
Creatinine, Ser: 0.67 mg/dL (ref 0.4–1.5)
GFR calc Af Amer: >60 mL/min (ref >60)
GFR calc non Af Amer: >60 mL/min (ref >60)
Sodium: 136 mEq/L (ref 135–145)
Total Protein: 5.4 g/dL — ABNORMAL LOW (ref 6.0–8.3)

## 2010-07-13 LAB — CSF CELL COUNT WITH DIFFERENTIAL
RBC Count, CSF: 3 /mm3 — ABNORMAL HIGH
WBC, CSF: 4 /mm3 (ref 0–5)

## 2010-07-13 LAB — PROTEIN AND GLUCOSE, CSF: Total  Protein, CSF: 59 mg/dL — ABNORMAL HIGH (ref 15–45)

## 2010-07-16 LAB — GLUCOSE, CAPILLARY: Glucose-Capillary: 36 mg/dL — CL (ref 70–99)

## 2010-07-19 LAB — APTT: aPTT: 27 seconds (ref 24–37)

## 2010-07-19 LAB — CBC
Hemoglobin: 15.1 g/dL (ref 13.0–17.0)
MCHC: 34.6 g/dL (ref 30.0–36.0)
RBC: 4.51 MIL/uL (ref 4.22–5.81)

## 2010-07-19 LAB — PROTIME-INR
INR: 0.96 (ref 0.00–1.49)
Prothrombin Time: 12.7 seconds (ref 11.6–15.2)

## 2010-07-19 LAB — GLUCOSE, CAPILLARY: Glucose-Capillary: 114 mg/dL — ABNORMAL HIGH (ref 70–99)

## 2010-07-21 ENCOUNTER — Other Ambulatory Visit (HOSPITAL_COMMUNITY): Payer: BC Managed Care – PPO

## 2010-07-28 ENCOUNTER — Encounter (HOSPITAL_COMMUNITY): Payer: Self-pay

## 2010-07-28 ENCOUNTER — Encounter (HOSPITAL_COMMUNITY)
Admission: RE | Admit: 2010-07-28 | Discharge: 2010-07-28 | Disposition: A | Discharge status: Home / Self Care | Payer: BC Managed Care – PPO | Source: Ambulatory Visit | Attending: Hematology & Oncology | Admitting: Hematology & Oncology

## 2010-07-28 DIAGNOSIS — C8589 Other specified types of non-Hodgkin lymphoma, extranodal and solid organ sites: Secondary | ICD-10-CM | POA: Insufficient documentation

## 2010-07-28 DIAGNOSIS — C8590 Non-Hodgkin lymphoma, unspecified, unspecified site: Secondary | ICD-10-CM

## 2010-07-28 HISTORY — DX: Malignant (primary) neoplasm, unspecified: C80.1

## 2010-07-28 MED ORDER — FLUDEOXYGLUCOSE F - 18 (FDG) INJECTION
17.2000 | Freq: Once | INTRAVENOUS | Status: AC | PRN
  Administered 2010-07-28: 17.2 via INTRAVENOUS

## 2010-07-30 NOTE — Discharge Summary (Signed)
NAME:  Walter [name], Walter [name] NO.:  [id number]  MEDICAL RECORD NO.:  [id number]           PATIENT TYPE:  I  LOCATION:  1316                         FACILITY:  [facility]  PHYSICIAN:  Rose Phi. Myna Hidalgo, M.D. DATE OF BIRTH:  08-Feb-1946  DATE OF ADMISSION:  06/29/2010 DATE OF DISCHARGE:  07/02/2010                              DISCHARGE SUMMARY   DISCHARGE DIAGNOSES: 1. Status post cycle #3 of high-dose methotrexate for central nervous     system prophylaxis. 2. Large cell non-Hodgkin lymphoma. 3. Hypertension. 4. Hyperlipidemia.  CONDITION ON DISCHARGE:  Stable.  ACTIVITIES:  As tolerated.  DIET:  No restrictions, although sugar needs to be watched with his diet.  MEDICATION ON DISCHARGE: 1. Aspirin 81 mg p.o. daily. 2. MiraLax 17 g p.o. daily p.r.n. 3. Pepcid 10 mg p.o. b.i.d. 4. Phenergan 12.5 mg p.o. q.6 h. p.r.n. 5. Vitamin C 1000 mg p.o. daily. 6. Allegra-D 180 mg p.o. daily p.r.n. 7. Voltaren 50 mg p.o. q.12 h. p.r.n. 8. Lipitor 20 mg p.o. daily - on hold.  FOLLOWUP:  Walter [name] will come back to the [place] [facility] cancer center in about to 3-4 weeks for followup.  Have a PET scan before he sees me.  HOSPITAL COURSE:  Walter [name] was admitted for his 3rd and final cycle of high-dose methotrexate.  He tolerated his first 2 cycles very very well.  He really had no complications with the chemotherapy.  We had to delay his admission by a day because of the methotrexate shortage.  Once he was admitted, he was started on IV fluids with sodium bicarb added.  His laboratory studies on admission were okay.  His white cell count 3.7, hemoglobin 13.6, and platelet count 165.  His metabolic panel showed normal electrolytes.  His glucose was 119.  His liver function tests were okay.  His BUN and creatinine were 11 and 0.7.  LDH was 152.  Uric acid was 4.6.  We made sure his urine was alkalinized.  He did very well with the urine alkalinization.  By the 28th,  his urine pH was over 7.5.  He received the methotrexate on the 28th.  His methotrexate dose was 3500 mg/m2.  His total methotrexate dose was 7700 mg.  This was given over 4 hours via IV.  He tolerated this well.  He got antiemetics with Zofran.  We then gave him a Zofran every 12 hours for antiemetic coverage.  He started his leucovorin 16 hours after completion of the methotrexate.  His initial methotrexate level on the 29th was 1.35.  We continued him on IV fluids.  We kept his urine alkalinized.  His laboratory studies all looked okay.  On March 1st, his BUN was 10, creatinine 0.67. Potassium 3.7 with a sodium of 136.  His LFTs were normal.  His SGPT was 66 and SGOT was 44.  At 2 p.m. on March 1st, his methotrexate level was 0.11.  As such, I felt that we could get him home.  His urine output was excellent.  I did give him some Lasix intermittently to help promote a diuresis.  His office vital signs  were fine.  He was afebrile.  His blood pressures were all okay.  Oxygen saturation on room air was adequate.  Of note, he did get Lovenox for his DVT prophylaxis.  His appetite was good.  He had no nausea, vomiting.  He was ambulating without any difficulties.  I felt that we could get him home on the evening of the 1st.  I really do not see any reason as to why we needed to keep him overnight.  PHYSICAL EXAMINATION:  Again, upon discharge, VITAL SIGNS:  Temperature of 98.1, pulse 76, respiratory rate 16, blood pressure 136/88, oxygen saturation on room air was 97%. HEAD/NECK:  No ocular or oral lesions.  There was no thrush.  He had no mucositis.  There was no scleral icterus.  Pupils react appropriately. He had no adenopathy in his neck. LUNGS:  Clear bilaterally.  There were no rales, wheezes, or rhonchi. CARDIAC:  Regular rate and rhythm with a normal S1, S2.  There were no murmurs, rubs, or bruits. ABDOMEN:  Soft with good bowel sounds.  There was no palpable  abdominal mass.  There was no palpable pedal splenomegaly. BACK:  No tenderness over the spine, ribs, or hips. EXTREMITIES:  No clubbing, cyanosis, or edema. SKIN:  No rashes, ecchymosis, or petechiae.     Rose Phi. Myna Hidalgo, M.D.     PRE/MEDQ  D:  07/02/2010  T:  07/03/2010  Job:  [id number]  Electronically Signed by Arlan Organ  on 07/30/2010 07:46:11 AM

## 2010-07-30 NOTE — H&P (Signed)
NAME:  Walter [name], Walter [name] NO.:  [id number]  MEDICAL RECORD NO.:  [id number]           PATIENT TYPE:  I  LOCATION:  1316                         FACILITY:  [facility]  PHYSICIAN:  Rose Phi. Myna Hidalgo, M.D. DATE OF BIRTH:  [birth year]  DATE OF ADMISSION:  06/29/2010 DATE OF DISCHARGE:                             HISTORY & PHYSICAL   REASON FOR ADMISSION: 1. Chemotherapy for CNS prophylaxis. 2. Diffuse large cell non-Hodgkin's lymphoma. 3. Hyperlipidemia. 4. Hypertension.  HISTORY OF PRESENT ILLNESS:  Walter [name] is a very nice 65 year old white gentleman with diffuse large cell non-Hodgkin's lymphoma.  He was at high risk for CNS relapse secondary to bone involvement.  He has already received systemic chemotherapy for the lymphoma.  His PET scans are negative.  He has had 2 cycles of high-dose systemic methotrexate.  He has tolerated these well.  He has had no complications.  He is treated with methotrexate effectively.  He now has been admitted for his third and final cycle of high-dose methotrexate.  He has had no problems with nausea and vomiting.  He has still been working full time.  He has had no cough, shortness breath.  He has had no pain.  He has had no headache.  He has had no change in bowel or bladder habits.  PAST MEDICAL HISTORY:  Remarkable for: 1. Hyperlipidemia. 2. Hypertension.  ALLERGIES:  None.  MEDICATIONS: 1. Aspirin 81 mg p.o. daily. 2. Pepcid 10 mg p.o. b.i.d. 3. Phenergan 12.5 mg p.o. q.6h. p.r.n. 4. Lipitor 20 mg p.o. daily. 5. MiraLax 17 gm p.o. daily as needed. 6. Voltaren 50 mg p.o. q.12h. p.r.n.  SOCIAL HISTORY:  Negative for tobacco or alcohol use.  FAMILY HISTORY:  Noncontributory.  PHYSICAL EXAM:  GENERAL:  This is a well-developed, well-nourished white gentleman in no obvious distress. VITAL SIGNS:  Temperature of 97.5, pulse 94, respiratory rate 16, blood pressure 118/81. HEENT:  Head exam shows normocephalic,  atraumatic skull.  His pupils react appropriately.  He has good extraocular muscle movement.  There are no oral lesions. NECK:  He has no adenopathy in his neck.  Thyroid is not palpable. LUNGS:  Clear to percussion and auscultation bilaterally. CARDIAC:  Regular rate and rhythm with normal S1 and S2.  There are no murmurs, rubs or bruits. ABDOMEN:  Soft.  Good bowel sounds.  There is no palpable abdominal mass.  There is no fluid wave.  There is no palpable hepatosplenomegaly. BACK:  No tenderness over the spine, ribs or hips. EXTREMITIES:  No clubbing, cyanosis or edema. SKIN:  No rashes, ecchymosis or petechia.  LABORATORY DATA:  CBC showed a white cell count of 3.7, hemoglobin of 13.6, hematocrit 39.2, platelet count 165,000.  Sodium 138, potassium 3.9, BUN 11, creatinine 0.7.  Liver function tests normal.  Albumin 3.5 with a calcium of 9.2.  LDH is 152.  Uric acid is 4.6.  IMPRESSION:  Walter [name] is a 65 year old gentleman with high-risk non- Hodgkin's lymphoma.  He is in remission systemically.  He now will receive the third and final cycle of chemotherapy for central nervous system prophylaxis.  We will alkalinize his  urine.  We will add sodium bicarb in his fluid. We will make sure his urine pH is greater than 7.5 when we start his treatment.  We will prescribe Leucovorin to start after his methotrexate is finished.  Will follow his methotrexate levels.  He will get prophylaxis for venous thromboembolism with Lovenox.  We will give him Lasix as needed to maintain adequate urine output.     Rose Phi. Myna Hidalgo, M.D.     PRE/MEDQ  D:  07/01/2010  T:  07/01/2010  Job:  [id number]  Electronically Signed by Arlan Organ  on 07/30/2010 07:46:16 AM

## 2010-07-31 ENCOUNTER — Other Ambulatory Visit: Payer: Self-pay | Admitting: Hematology & Oncology

## 2010-07-31 ENCOUNTER — Encounter (HOSPITAL_BASED_OUTPATIENT_CLINIC_OR_DEPARTMENT_OTHER): Payer: BC Managed Care – PPO | Admitting: Hematology & Oncology

## 2010-07-31 DIAGNOSIS — C8589 Other specified types of non-Hodgkin lymphoma, extranodal and solid organ sites: Secondary | ICD-10-CM

## 2010-07-31 DIAGNOSIS — C8588 Other specified types of non-Hodgkin lymphoma, lymph nodes of multiple sites: Secondary | ICD-10-CM

## 2010-07-31 LAB — CBC WITH DIFFERENTIAL (CANCER CENTER ONLY)
BASO#: 0.1 10*3/uL (ref 0.0–0.2)
EOS%: 2.8 % (ref 0.0–7.0)
HCT: 41.4 % (ref 38.7–49.9)
HGB: 14.7 g/dL (ref 13.0–17.1)
LYMPH%: 46.9 % (ref 14.0–48.0)
MCH: 33.1 pg (ref 28.0–33.4)
MCHC: 35.5 g/dL (ref 32.0–35.9)
MONO%: 36.2 % — ABNORMAL HIGH (ref 0.0–13.0)
NEUT#: 0.3 10*3/uL — CL (ref 1.5–6.5)
NEUT%: 11.8 % — ABNORMAL LOW (ref 40.0–80.0)

## 2010-08-01 LAB — COMPREHENSIVE METABOLIC PANEL
AST: 23 U/L (ref 0–37)
Alkaline Phosphatase: 96 U/L (ref 39–117)
BUN: 16 mg/dL (ref 6–23)
Creatinine, Ser: 0.7 mg/dL (ref 0.40–1.50)
Total Bilirubin: 0.5 mg/dL (ref 0.3–1.2)

## 2010-09-04 ENCOUNTER — Other Ambulatory Visit: Payer: Self-pay | Admitting: Hematology & Oncology

## 2010-09-04 ENCOUNTER — Encounter: Payer: BC Managed Care – PPO | Admitting: Hematology & Oncology

## 2010-09-04 ENCOUNTER — Encounter (HOSPITAL_BASED_OUTPATIENT_CLINIC_OR_DEPARTMENT_OTHER): Payer: BC Managed Care – PPO | Admitting: Hematology & Oncology

## 2010-09-04 DIAGNOSIS — Z452 Encounter for adjustment and management of vascular access device: Secondary | ICD-10-CM

## 2010-09-04 DIAGNOSIS — C8589 Other specified types of non-Hodgkin lymphoma, extranodal and solid organ sites: Secondary | ICD-10-CM

## 2010-09-04 LAB — CBC WITH DIFFERENTIAL (CANCER CENTER ONLY)
Eosinophils Absolute: 0.1 10*3/uL (ref 0.0–0.5)
HCT: 40.7 % (ref 38.7–49.9)
LYMPH#: 1.4 10*3/uL (ref 0.9–3.3)
LYMPH%: 26.2 % (ref 14.0–48.0)
MCV: 93 fL (ref 82–98)
MONO#: 0.6 10*3/uL (ref 0.1–0.9)
NEUT%: 60.9 % (ref 40.0–80.0)
RBC: 4.36 10*6/uL (ref 4.20–5.70)
WBC: 5.2 10*3/uL (ref 4.0–10.0)

## 2010-09-23 NOTE — H&P (Signed)
NAME:  MEYER, ARORA NO.:  [id number]  MEDICAL RECORD NO.:  [id number]           PATIENT TYPE:  I  LOCATION:  1321                         FACILITY:  [facility]  PHYSICIAN:  Josph Macho, M.D.  DATE OF BIRTH:  Mar 05, 1946  DATE OF ADMISSION:  06/01/2010 DATE OF DISCHARGE:  06/05/2010                             HISTORY & PHYSICAL   REASON FOR ADMISSION: 1. Cycle #2 of high-dose methotrexate. 2. History of non-Hodgkin's lymphoma. 3. Hypertension.  HISTORY OF PRESENT ILLNESS:  Mr. Bevis is a 65 year old gentleman with non-Hodgkin's lymphoma.  He has stage 4 disease.  He was treated with systemic chemotherapy.  He is in remission.  He was admitted on May 14, 2010, for his first cycle of high-dose methotrexate for CNS prophylaxis.  He tolerated this well.  He now has been admitted for cycle #2.  He has not had any problems with abdominal pain.  There is no nausea, vomiting.  He has had no headache. There has been no diarrhea or constipation.  His blood sugars have been little bit on the high side but this is controlled with diet.  PAST MEDICAL HISTORY: 1. Hypertension. 2. Hyperlipidemia.  ALLERGIES:  None.  MEDICATIONS:  Admission medications are aspirin 81 mg p.o. daily, MiraLax 17 g p.o. day, Pepcid 10 mg p.o. b.i.d., Phenergan 12.5 mg p.o. q.6 h p.r.n., Voltaren 50 mg p.o. q.6 h p.r.n., Lipitor 20 mg p.o. daily.  Hytrin on hold.  SOCIAL HISTORY:  Negative for tobacco or alcohol use.  FAMILY HISTORY:  Noncontributory.  PHYSICAL EXAMINATION:  GENERAL:  This is a well-developed, well- nourished white gentleman, in no obvious distress. VITAL SIGNS:  Temperature of 98, pulse 76, respiratory rate 16, blood pressure 136/88. HEAD AND NECK:  Head and neck exam shows normocephalic, atraumatic skull.  There are no ocular or oral lesions.  He has no mucositis.  He has no adenopathy in his neck. LUNGS:  Clear to percussion and auscultation  bilaterally. CARDIAC EXAM:  Regular rate and rhythm with normal S1, S2.  There are no murmurs, rubs or bruits. ABDOMEN:  Soft, good bowel sounds.  There is no fluid wave.  There is no guarding or rebound tenderness.  There is no palpable hepatosplenomegaly. BACK EXAM:  No tenderness of the spine, ribs or hips. EXTREMITIES:  Shows no clubbing, cyanosis or edema. SKIN EXAM:  No rashes, ecchymosis or petechiae. NEUROLOGICAL EXAM:  She has no focal neurological deficits.  LABORATORY STUDIES:  White cell count of 3.2, hemoglobin 13, hematocrit 37, platelet count is 220.  Sodium 138, potassium 3.9, BUN 30, creatinine of 0.76.  Glucose 205.  LFTs are relatively normal.  LDH is 137.  IMPRESSION:  Mr. Tigges is a 65 year old gentleman with history of stage 4 non-Hodgkin's lymphoma.  He now has been admitted for cycle #2 of high-dose methotrexate for CNS prophylaxis.  I will go ahead and access his Port-A-Cath.  We will give him some IV fluids.  We will put him on sodium bicarb and IV fluids.  We will check his urinalysis.  We will check his urine pH.  Once his pH is above 7.6,  we will give his methotrexate.  His blood sugar is rather on the high side.  We will have to watch this right now.  I do not think this needs to be treated with regular insulin on a schedule.  He does not have any x-rays at this point in time.     Josph Macho, M.D.     PRE/MEDQ  D:  09/09/2010  T:  09/09/2010  Job:  [id number]  Electronically Signed by Arlan Organ  on 09/23/2010 06:53:08 PM

## 2010-10-15 ENCOUNTER — Encounter (HOSPITAL_COMMUNITY): Payer: Self-pay

## 2010-10-15 ENCOUNTER — Encounter (HOSPITAL_COMMUNITY)
Admission: RE | Admit: 2010-10-15 | Discharge: 2010-10-15 | Disposition: A | Discharge status: Home / Self Care | Payer: BC Managed Care – PPO | Source: Ambulatory Visit | Attending: Hematology & Oncology | Admitting: Hematology & Oncology

## 2010-10-15 DIAGNOSIS — C8589 Other specified types of non-Hodgkin lymphoma, extranodal and solid organ sites: Secondary | ICD-10-CM | POA: Insufficient documentation

## 2010-10-15 DIAGNOSIS — C8588 Other specified types of non-Hodgkin lymphoma, lymph nodes of multiple sites: Secondary | ICD-10-CM

## 2010-10-15 DIAGNOSIS — Z9221 Personal history of antineoplastic chemotherapy: Secondary | ICD-10-CM | POA: Insufficient documentation

## 2010-10-15 LAB — GLUCOSE, CAPILLARY: Glucose-Capillary: 92 mg/dL (ref 70–99)

## 2010-10-15 MED ORDER — FLUDEOXYGLUCOSE F - 18 (FDG) INJECTION
18.5000 | Freq: Once | INTRAVENOUS | Status: AC | PRN
  Administered 2010-10-15: 18.5 via INTRAVENOUS

## 2010-11-10 ENCOUNTER — Other Ambulatory Visit (HOSPITAL_COMMUNITY): Payer: BC Managed Care – PPO

## 2010-11-12 ENCOUNTER — Other Ambulatory Visit: Payer: Self-pay | Admitting: Hematology & Oncology

## 2010-11-12 ENCOUNTER — Encounter (HOSPITAL_BASED_OUTPATIENT_CLINIC_OR_DEPARTMENT_OTHER): Payer: BC Managed Care – PPO | Admitting: Hematology & Oncology

## 2010-11-12 DIAGNOSIS — C8590 Non-Hodgkin lymphoma, unspecified, unspecified site: Secondary | ICD-10-CM

## 2010-11-12 LAB — CBC WITH DIFFERENTIAL (CANCER CENTER ONLY)
BASO#: 0 10*3/uL (ref 0.0–0.2)
EOS%: 1.1 % (ref 0.0–7.0)
Eosinophils Absolute: 0.1 10*3/uL (ref 0.0–0.5)
HCT: 42.4 % (ref 38.7–49.9)
HGB: 15.4 g/dL (ref 13.0–17.1)
LYMPH#: 1.2 10*3/uL (ref 0.9–3.3)
MCH: 33.3 pg (ref 28.0–33.4)
MCHC: 36.3 g/dL — ABNORMAL HIGH (ref 32.0–35.9)
NEUT%: 67.8 % (ref 40.0–80.0)
RBC: 4.62 10*6/uL (ref 4.20–5.70)

## 2010-11-12 LAB — COMPREHENSIVE METABOLIC PANEL
AST: 17 U/L (ref 0–37)
Albumin: 4.3 g/dL (ref 3.5–5.2)
BUN: 15 mg/dL (ref 6–23)
CO2: 25 mEq/L (ref 19–32)
Calcium: 9.5 mg/dL (ref 8.4–10.5)
Chloride: 104 mEq/L (ref 96–112)
Creatinine, Ser: 0.65 mg/dL (ref 0.50–1.35)
Glucose, Bld: 98 mg/dL (ref 70–99)
Potassium: 4.4 mEq/L (ref 3.5–5.3)

## 2010-11-12 LAB — VITAMIN D 25 HYDROXY (VIT D DEFICIENCY, FRACTURES): Vit D, 25-Hydroxy: 48 ng/mL (ref 30–89)

## 2010-11-12 LAB — LACTATE DEHYDROGENASE: LDH: 118 U/L (ref 94–250)

## 2010-12-31 ENCOUNTER — Other Ambulatory Visit: Payer: Self-pay | Admitting: Hematology & Oncology

## 2010-12-31 DIAGNOSIS — C8588 Other specified types of non-Hodgkin lymphoma, lymph nodes of multiple sites: Secondary | ICD-10-CM

## 2010-12-31 DIAGNOSIS — C8589 Other specified types of non-Hodgkin lymphoma, extranodal and solid organ sites: Secondary | ICD-10-CM

## 2010-12-31 DIAGNOSIS — Z452 Encounter for adjustment and management of vascular access device: Secondary | ICD-10-CM

## 2011-01-27 ENCOUNTER — Other Ambulatory Visit (HOSPITAL_BASED_OUTPATIENT_CLINIC_OR_DEPARTMENT_OTHER): Payer: BC Managed Care – PPO

## 2011-01-28 ENCOUNTER — Encounter (HOSPITAL_COMMUNITY)
Admission: RE | Admit: 2011-01-28 | Discharge: 2011-01-28 | Disposition: A | Discharge status: Home / Self Care | Payer: BC Managed Care – PPO | Source: Ambulatory Visit | Attending: Hematology & Oncology | Admitting: Hematology & Oncology

## 2011-01-28 ENCOUNTER — Encounter (HOSPITAL_COMMUNITY): Payer: Self-pay

## 2011-01-28 DIAGNOSIS — Z79899 Other long term (current) drug therapy: Secondary | ICD-10-CM | POA: Insufficient documentation

## 2011-01-28 DIAGNOSIS — J984 Other disorders of lung: Secondary | ICD-10-CM | POA: Insufficient documentation

## 2011-01-28 DIAGNOSIS — C8589 Other specified types of non-Hodgkin lymphoma, extranodal and solid organ sites: Secondary | ICD-10-CM | POA: Insufficient documentation

## 2011-01-28 DIAGNOSIS — C8588 Other specified types of non-Hodgkin lymphoma, lymph nodes of multiple sites: Secondary | ICD-10-CM

## 2011-01-28 LAB — GLUCOSE, CAPILLARY: Glucose-Capillary: 104 mg/dL — ABNORMAL HIGH (ref 70–99)

## 2011-01-28 MED ORDER — FLUDEOXYGLUCOSE F - 18 (FDG) INJECTION
18.8000 | Freq: Once | INTRAVENOUS | Status: AC | PRN
  Administered 2011-01-28: 18.8 via INTRAVENOUS

## 2011-02-10 ENCOUNTER — Encounter (HOSPITAL_BASED_OUTPATIENT_CLINIC_OR_DEPARTMENT_OTHER): Payer: BC Managed Care – PPO | Admitting: Hematology & Oncology

## 2011-02-10 ENCOUNTER — Other Ambulatory Visit: Payer: Self-pay | Admitting: Hematology & Oncology

## 2011-02-10 DIAGNOSIS — C8589 Other specified types of non-Hodgkin lymphoma, extranodal and solid organ sites: Secondary | ICD-10-CM

## 2011-02-10 DIAGNOSIS — C8590 Non-Hodgkin lymphoma, unspecified, unspecified site: Secondary | ICD-10-CM

## 2011-02-10 DIAGNOSIS — Z452 Encounter for adjustment and management of vascular access device: Secondary | ICD-10-CM

## 2011-02-10 LAB — COMPREHENSIVE METABOLIC PANEL
Albumin: 4.1 g/dL (ref 3.5–5.2)
Alkaline Phosphatase: 96 U/L (ref 39–117)
BUN: 13 mg/dL (ref 6–23)
CO2: 25 mEq/L (ref 19–32)
Calcium: 8.9 mg/dL (ref 8.4–10.5)
Chloride: 102 mEq/L (ref 96–112)
Glucose, Bld: 83 mg/dL (ref 70–99)
Potassium: 4 mEq/L (ref 3.5–5.3)

## 2011-02-10 LAB — HEMOGLOBIN A1C: Mean Plasma Glucose: 111 mg/dL (ref <117)

## 2011-02-10 LAB — CBC WITH DIFFERENTIAL (CANCER CENTER ONLY)
BASO%: 0.6 % (ref 0.0–2.0)
EOS%: 2.6 % (ref 0.0–7.0)
LYMPH#: 1.8 10*3/uL (ref 0.9–3.3)
MCHC: 36.2 g/dL — ABNORMAL HIGH (ref 32.0–35.9)
MONO#: 0.7 10*3/uL (ref 0.1–0.9)
NEUT#: 3.6 10*3/uL (ref 1.5–6.5)
NEUT%: 57.5 % (ref 40.0–80.0)
Platelets: 155 10*3/uL (ref 145–400)
RDW: 12.5 % (ref 11.1–15.7)
WBC: 6.2 10*3/uL (ref 4.0–10.0)

## 2011-02-10 LAB — VITAMIN D 25 HYDROXY (VIT D DEFICIENCY, FRACTURES): Vit D, 25-Hydroxy: 48 ng/mL (ref 30–89)

## 2011-02-10 LAB — LACTATE DEHYDROGENASE: LDH: 140 U/L (ref 94–250)

## 2011-03-31 ENCOUNTER — Ambulatory Visit: Payer: BC Managed Care – PPO

## 2011-03-31 DIAGNOSIS — C8588 Other specified types of non-Hodgkin lymphoma, lymph nodes of multiple sites: Secondary | ICD-10-CM

## 2011-03-31 MED ORDER — HEPARIN SOD (PORK) LOCK FLUSH 100 UNIT/ML IV SOLN
500.0000 [IU] | Freq: Once | INTRAVENOUS | Status: AC
  Administered 2011-03-31: 500 [IU] via INTRAVENOUS
  Filled 2011-03-31: qty 5

## 2011-03-31 MED ORDER — SODIUM CHLORIDE 0.9 % IJ SOLN
10.0000 mL | INTRAMUSCULAR | Status: DC | PRN
  Administered 2011-03-31: 10 mL via INTRAVENOUS
  Filled 2011-03-31: qty 10

## 2011-05-14 ENCOUNTER — Telehealth: Payer: Self-pay | Admitting: Hematology & Oncology

## 2011-05-14 ENCOUNTER — Encounter (HOSPITAL_COMMUNITY): Payer: Self-pay

## 2011-05-14 ENCOUNTER — Encounter (HOSPITAL_COMMUNITY)
Admission: RE | Admit: 2011-05-14 | Discharge: 2011-05-14 | Disposition: A | Discharge status: Home / Self Care | Payer: BC Managed Care – PPO | Source: Ambulatory Visit | Attending: Hematology & Oncology | Admitting: Hematology & Oncology

## 2011-05-14 DIAGNOSIS — C8590 Non-Hodgkin lymphoma, unspecified, unspecified site: Secondary | ICD-10-CM

## 2011-05-14 DIAGNOSIS — C8589 Other specified types of non-Hodgkin lymphoma, extranodal and solid organ sites: Secondary | ICD-10-CM | POA: Insufficient documentation

## 2011-05-14 LAB — GLUCOSE, CAPILLARY: Glucose-Capillary: 102 mg/dL — ABNORMAL HIGH (ref 70–99)

## 2011-05-14 MED ORDER — FLUDEOXYGLUCOSE F - 18 (FDG) INJECTION
17.0000 | Freq: Once | INTRAVENOUS | Status: AC | PRN
  Administered 2011-05-14: 17 via INTRAVENOUS

## 2011-05-14 NOTE — Telephone Encounter (Signed)
I left a message that the PET scan was (-) for any residual or recurrent NHL!!!!!  PRE

## 2011-05-17 ENCOUNTER — Encounter: Payer: Self-pay | Admitting: *Deleted

## 2011-05-17 NOTE — Progress Notes (Signed)
As requested, pt's office notes and treatments and scans requested faxed to Dr. Ailene Rud At [facility] in [place], Agent [facility].

## 2011-05-21 ENCOUNTER — Other Ambulatory Visit (HOSPITAL_BASED_OUTPATIENT_CLINIC_OR_DEPARTMENT_OTHER): Payer: BC Managed Care – PPO | Admitting: Lab

## 2011-05-21 ENCOUNTER — Ambulatory Visit (HOSPITAL_BASED_OUTPATIENT_CLINIC_OR_DEPARTMENT_OTHER): Payer: BC Managed Care – PPO | Admitting: Hematology & Oncology

## 2011-05-21 VITALS — BP 103/78 | HR 67 | Temp 97.0°F | Ht 70.0 in | Wt 204.0 lb

## 2011-05-21 DIAGNOSIS — C8589 Other specified types of non-Hodgkin lymphoma, extranodal and solid organ sites: Secondary | ICD-10-CM

## 2011-05-21 DIAGNOSIS — E559 Vitamin D deficiency, unspecified: Secondary | ICD-10-CM

## 2011-05-21 DIAGNOSIS — C8590 Non-Hodgkin lymphoma, unspecified, unspecified site: Secondary | ICD-10-CM

## 2011-05-21 DIAGNOSIS — M549 Dorsalgia, unspecified: Secondary | ICD-10-CM

## 2011-05-21 LAB — CBC WITH DIFFERENTIAL (CANCER CENTER ONLY)
BASO%: 0.7 % (ref 0.0–2.0)
EOS%: 2.3 % (ref 0.0–7.0)
HCT: 41.4 % (ref 38.7–49.9)
LYMPH#: 2.4 10*3/uL (ref 0.9–3.3)
MCHC: 35.5 g/dL (ref 32.0–35.9)
MONO%: 12.3 % (ref 0.0–13.0)
NEUT%: 52.4 % (ref 40.0–80.0)
RDW: 12.3 % (ref 11.1–15.7)

## 2011-05-21 NOTE — Progress Notes (Signed)
This office note has been dictated.

## 2011-05-22 LAB — COMPREHENSIVE METABOLIC PANEL
ALT: 20 U/L (ref 0–53)
AST: 17 U/L (ref 0–37)
Alkaline Phosphatase: 89 U/L (ref 39–117)
Calcium: 9.2 mg/dL (ref 8.4–10.5)
Chloride: 102 mEq/L (ref 96–112)
Creatinine, Ser: 0.8 mg/dL (ref 0.50–1.35)
Total Bilirubin: 0.4 mg/dL (ref 0.3–1.2)

## 2011-05-22 NOTE — Progress Notes (Signed)
CC:   Walter [name], M.D. Walter Punches, MD Walter [name], M.D.  DIAGNOSIS:  Diffuse non-Hodgkin's lymphoma.  CURRENT THERAPY:  Observation.  INTERVAL HISTORY:  Walter [name] comes in for followup.  He is doing okay. He is still working.  He is considering retirement.  There is a lot of stress at work.  We did go ahead and repeat a PET scan on him.  This was done on 01/11. The PET scan did not show any evidence of recurrent lymphoma.  There is some activity where his Port-A-Cath is present.  He has had no pain issues outside of his back.  His lower back has bothered him on and off.  There has been no change in bowel or bladder habits.  He has not noticed any bleeding.  He has had no cough.  There is no nausea or vomiting.  He has had no leg swelling.  He has not noticed any rashes.  Overall, his performance status is ECOG 1.  PHYSICAL EXAMINATION:  General:  This is a well-developed, well- nourished white gentleman in no obvious distress.  Vital signs:  Show temperature of 97, pulse 67, respiratory rate 14, blood pressure 103/78. Weight is 204.  Head and neck:  Exam shows a normocephalic, atraumatic skull.  There are no ocular or oral lesions.  There are no palpable cervical or supraclavicular lymph nodes.  Lungs:  Are clear bilaterally. Cardiac:  Regular rate and rhythm with a normal S1, S2.  There are no murmurs, rubs or bruits.  Abdomen:  Soft with good bowel sounds.  There is no palpable abdominal mass.  There is no fluid wave.  There is no palpable hepatosplenomegaly.  Back:  No tenderness over the spine, ribs or hips are noted.  Extremities:  Show no clubbing, cyanosis or edema. Neurological:  Exam shows no focal neurological deficits.  LABORATORY STUDIES:  White cell count is 7.3, hemoglobin 14.7, hematocrit 41.4, platelet count 155.  IMPRESSION:  Walter [name] is a 66 year old gentleman with a history of diffuse non-Hodgkin's lymphoma.  He was treated with 6 cycles of  R-CHOP. He completed this back in 03/2010.  He then received high dose methotrexate.  He received 4 cycles of this and that was completed in 07/2010.  For now, I think we can just follow him along.  I do not see any evidence of recurrent lymphoma.  I think that if he does begin to have any kind of symptoms, then we may have to look into the possibility of recurrence.  He is at risk for recurrence because of his presentation with bony involvement.  We will go ahead and plan to get him back in 4 months' time now.  He still has his Port-A-Cath in.  We will have to make sure he gets his Port-A-Cath flushed every couple months.    ______________________________ Walter [name], M.D. PRE/MEDQ  D:  05/21/2011  T:  05/22/2011  Job:  [id number]

## 2011-05-24 ENCOUNTER — Telehealth: Payer: Self-pay | Admitting: Hematology & Oncology

## 2011-05-24 NOTE — Telephone Encounter (Signed)
Mailed march and may 2013 schedule

## 2011-05-25 ENCOUNTER — Other Ambulatory Visit: Payer: Self-pay | Admitting: Hematology & Oncology

## 2011-07-16 ENCOUNTER — Ambulatory Visit (HOSPITAL_BASED_OUTPATIENT_CLINIC_OR_DEPARTMENT_OTHER): Payer: BC Managed Care – PPO

## 2011-07-16 VITALS — BP 118/74 | HR 65

## 2011-07-16 DIAGNOSIS — Z452 Encounter for adjustment and management of vascular access device: Secondary | ICD-10-CM

## 2011-07-16 DIAGNOSIS — C8590 Non-Hodgkin lymphoma, unspecified, unspecified site: Secondary | ICD-10-CM

## 2011-07-16 DIAGNOSIS — C8589 Other specified types of non-Hodgkin lymphoma, extranodal and solid organ sites: Secondary | ICD-10-CM

## 2011-07-16 MED ORDER — SODIUM CHLORIDE 0.9 % IJ SOLN
10.0000 mL | INTRAMUSCULAR | Status: DC | PRN
  Administered 2011-07-16: 10 mL via INTRAVENOUS

## 2011-07-16 MED ORDER — HEPARIN SOD (PORK) LOCK FLUSH 100 UNIT/ML IV SOLN
500.0000 [IU] | Freq: Once | INTRAVENOUS | Status: AC | PRN
  Administered 2011-07-16: 500 [IU] via INTRAVENOUS

## 2011-07-28 ENCOUNTER — Telehealth: Payer: Self-pay | Admitting: *Deleted

## 2011-07-28 DIAGNOSIS — J069 Acute upper respiratory infection, unspecified: Secondary | ICD-10-CM

## 2011-07-28 MED ORDER — AZITHROMYCIN 250 MG PO TABS: ORAL_TABLET | ORAL | Status: AC

## 2011-07-28 NOTE — Telephone Encounter (Signed)
Pt's wife left a message on the voicemail stating he had the flu and wants something called in. Returned her call asking for specific symptoms and she said he was having cough, sore throat, sneezing, achiness and a temp of 100.9 last night. He is afebrile this morning. Reviewed with Dr Myna Hidalgo. To start a Z-pak but to re-establish himself with his PCP as he is currently under observation. She verbalized understanding. Rx sent to [facility] via [facility] as requested.

## 2011-07-30 ENCOUNTER — Other Ambulatory Visit: Payer: Self-pay | Admitting: Hematology & Oncology

## 2011-07-30 DIAGNOSIS — C8590 Non-Hodgkin lymphoma, unspecified, unspecified site: Secondary | ICD-10-CM

## 2011-08-18 ENCOUNTER — Encounter (HOSPITAL_COMMUNITY)
Admission: RE | Admit: 2011-08-18 | Discharge: 2011-08-18 | Disposition: A | Discharge status: Home / Self Care | Payer: BC Managed Care – PPO | Source: Ambulatory Visit | Attending: Hematology & Oncology | Admitting: Hematology & Oncology

## 2011-08-18 DIAGNOSIS — C8589 Other specified types of non-Hodgkin lymphoma, extranodal and solid organ sites: Secondary | ICD-10-CM | POA: Insufficient documentation

## 2011-08-18 DIAGNOSIS — C8590 Non-Hodgkin lymphoma, unspecified, unspecified site: Secondary | ICD-10-CM

## 2011-08-18 DIAGNOSIS — R911 Solitary pulmonary nodule: Secondary | ICD-10-CM | POA: Insufficient documentation

## 2011-08-18 DIAGNOSIS — M948X9 Other specified disorders of cartilage, unspecified sites: Secondary | ICD-10-CM | POA: Insufficient documentation

## 2011-08-18 MED ORDER — FLUDEOXYGLUCOSE F - 18 (FDG) INJECTION
17.9000 | Freq: Once | INTRAVENOUS | Status: AC | PRN
  Administered 2011-08-18: 17.9 via INTRAVENOUS

## 2011-09-17 ENCOUNTER — Ambulatory Visit (HOSPITAL_BASED_OUTPATIENT_CLINIC_OR_DEPARTMENT_OTHER): Payer: BC Managed Care – PPO | Admitting: Hematology & Oncology

## 2011-09-17 ENCOUNTER — Ambulatory Visit: Payer: BC Managed Care – PPO

## 2011-09-17 ENCOUNTER — Other Ambulatory Visit (HOSPITAL_BASED_OUTPATIENT_CLINIC_OR_DEPARTMENT_OTHER): Payer: BC Managed Care – PPO | Admitting: Lab

## 2011-09-17 VITALS — BP 103/73 | HR 65 | Temp 96.7°F | Ht 70.0 in | Wt 208.0 lb

## 2011-09-17 DIAGNOSIS — C8589 Other specified types of non-Hodgkin lymphoma, extranodal and solid organ sites: Secondary | ICD-10-CM

## 2011-09-17 DIAGNOSIS — C8590 Non-Hodgkin lymphoma, unspecified, unspecified site: Secondary | ICD-10-CM

## 2011-09-17 LAB — CBC WITH DIFFERENTIAL (CANCER CENTER ONLY)
Eosinophils Absolute: 0.2 10*3/uL (ref 0.0–0.5)
HCT: 38.3 % — ABNORMAL LOW (ref 38.7–49.9)
HGB: 13.7 g/dL (ref 13.0–17.1)
LYMPH#: 2.2 10*3/uL (ref 0.9–3.3)
LYMPH%: 38.5 % (ref 14.0–48.0)
MCV: 95 fL (ref 82–98)
MONO#: 0.7 10*3/uL (ref 0.1–0.9)
NEUT%: 46 % (ref 40.0–80.0)
RBC: 4.05 10*6/uL — ABNORMAL LOW (ref 4.20–5.70)
WBC: 5.7 10*3/uL (ref 4.0–10.0)

## 2011-09-17 LAB — COMPREHENSIVE METABOLIC PANEL
CO2: 28 mEq/L (ref 19–32)
Calcium: 8.5 mg/dL (ref 8.4–10.5)
Chloride: 103 mEq/L (ref 96–112)
Creatinine, Ser: 0.67 mg/dL (ref 0.50–1.35)
Glucose, Bld: 104 mg/dL — ABNORMAL HIGH (ref 70–99)
Total Bilirubin: 0.6 mg/dL (ref 0.3–1.2)
Total Protein: 5.8 g/dL — ABNORMAL LOW (ref 6.0–8.3)

## 2011-09-17 LAB — LACTATE DEHYDROGENASE: LDH: 122 U/L (ref 94–250)

## 2011-09-17 MED ORDER — SODIUM CHLORIDE 0.9 % IJ SOLN
10.0000 mL | INTRAMUSCULAR | Status: DC | PRN
  Administered 2011-09-17: 10 mL via INTRAVENOUS
  Filled 2011-09-17: qty 10

## 2011-09-17 MED ORDER — HEPARIN SOD (PORK) LOCK FLUSH 100 UNIT/ML IV SOLN
500.0000 [IU] | Freq: Once | INTRAVENOUS | Status: AC
Start: 2011-09-17 — End: 2011-09-17
  Administered 2011-09-17: 500 [IU] via INTRAVENOUS
  Filled 2011-09-17: qty 5

## 2011-09-17 NOTE — Progress Notes (Signed)
This office note has been dictated.

## 2011-09-18 NOTE — Progress Notes (Signed)
CC:   [name], M.D. Bethann Punches, MD Payton Doughty, M.D.  DIAGNOSIS:  Diffuse non-Hodgkin lymphoma, clinical remission.  CURRENT THERAPY:  Observation.  INTERIM HISTORY:  Mr. Corsino comes in for his followup.  He is still doing okay.  He is still working.  He is now over a year out from his chemotherapy.  I think we can probably get his Port-A-Cath taken out now.  He had a PET scan done.  This was done last week.  The PET scan did not show any evidence of recurrent disease.  He has had some occasional back discomfort.  He has had no change in bowel or bladder habits.  He said his blood sugars are doing okay. There has been no leg swelling.  He has had a little bit of a cough.  PHYSICAL EXAMINATION:  This is a well-developed, well-nourished white gentleman in no obvious distress.  Vital Signs:  Temperature of 96, 7, pulse 65, respiratory rate 20, blood pressure 103/73.  Weight is 208. Head and neck:  Normocephalic, atraumatic skull.  There are no ocular or oral lesions.  There are no palpable cervical or supraclavicular lymph nodes.  Lungs:  Clear bilaterally.  Cardiac:  Regular rate and rhythm with a normal S1 and S2.  There are no murmurs, rubs or bruits. Abdomen:  Soft with good bowel sounds.  There is no palpable abdominal mass.  There is no fluid wave.  There is no palpable hepatosplenomegaly. Back:  No tenderness over the spine, ribs, or hips.  Extremities:  No clubbing, cyanosis or edema.  Skin:  No rashes, ecchymosis or petechia. Neurologic:  No focal neurological deficits.  LABORATORY STUDIES:  White cell count 5.7, hemoglobin 13.7, hematocrit 30.3, platelet count 128.  IMPRESSION:  Mr. Alpern is a 66 year old gentleman with non-Hodgkin lymphoma.  This was a diffuse cell lymphoma.  Unfortunately, with the limited biopsy material that we have, we cannot tell if this was a diffuse large cell or small cell subtype.  However, clinically it was behaving as an  aggressive type lymphoma, so I did not feel that we had to put him through a major surgical procedure for biopsy and better specimen.  He is in remission.  He was treated as if he had an aggressive type of lymphoma.  We will go ahead and plan to get him back in another 4 months.  I really think that we can hold off on any PET scans unless he begins to have symptoms. He is at risk for recurrence so we have to be cautious and be aggressive with followup if there are any problems that might suggest recurrence.  Will get his Port-A-Cath taken out in the next week or so.    ______________________________ Josph Macho, M.D. PRE/MEDQ  D:  09/17/2011  T:  09/18/2011  Job:  [id number]

## 2011-09-20 ENCOUNTER — Telehealth: Payer: Self-pay | Admitting: Hematology & Oncology

## 2011-09-20 NOTE — Telephone Encounter (Signed)
Mailed 9-20 schedule. Tina from IR will call patient to schedule port removal.

## 2011-09-20 NOTE — Letter (Signed)
Sep 17, 2011     NAME:  MARVIN, GRABILL MRN:  [id number] DOB:  [birth year]  Dear Milford Cage or Madam:  This letter is in regard to Mr. Landon Bassford.  He is a patient at the [facility] in [place], [place].  He was diagnosed back in June 2011 with a diffuse non-Hodgkin lymphoma. This was affecting his lymph nodes but also his bones.  Mr. Zaremba underwent highly-aggressive chemotherapy.  He was treated with a standard regimen for aggressive lymphoma, that being R-CHOP.  He received 8 cycles of chemotherapy.  He did get into remission.  He was then treated with high-dose methotrexate to help prevent relapse into his central nervous system.  He got 3 cycles of high-dose methotrexate that was completed in March.  Mr. 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  He did serve in [place].  There was exposure to a known carcinogen that the Department of Defense has recognized as a risk factor for lymphoma.  Mr. Pretlow clearly had to put his life "on hold" for over a year while he was being treated for this lymphoma.  If he were not treated for his lymphoma, this would have taken his life.  Clearly, in my mind, Mr. Majette only risk factor for his lymphoma was of the exposure to carcinogens while he was in [place].  I am sure that he had Agent Orange exposure.  I believe that Mr. Levi is definitely entitled to full VA benefits because of his service in [place] and subsequently developing non- Hodgkin lymphoma.  I would like to graciously reminded the federal government that Mr. Foister has proudly served his country.  He served his country voluntarily.  I would expect that the federal government would take care of Mr. Treto likewise and award him the full benefit that he is entitled to for his service to our country and coming down with lymphoma.  Again, Mr Millea has no other risk factors for developing non-Hodgkin lymphoma outside of  his service in [place] and exposure to [employer].  Mrs. Seckel, thankfully, is in remission right now.  He is not considered cured.  I would not consider him cured probably for another 4 or 5 years.  Again, I would like to remind the federal government that Mr. Hockey put his life at risk by going into the military and proudly serving in [place].  His exposures to carcinogens over there have had a negative impact on his life with respect to him having to take chemotherapy.  He is at risk for long-term consequences from his chemotherapy that we will need to monitor.  Again, I would most graciously request that the federal government accord Mr. Mcfarlan the fall benefits that he is deserving of because of his patriotic service to our country.  If there is further medical information that is needed regarding Mr. Neas, please feel free to let me know at [phone].  Respectfully yours,    Josph Macho, M.D.  PRE/MEDQ  D:  09/17/2011  T:  09/17/2011  Job:  [id number]

## 2011-09-22 ENCOUNTER — Other Ambulatory Visit: Payer: Self-pay | Admitting: Hematology & Oncology

## 2011-09-22 ENCOUNTER — Encounter (HOSPITAL_COMMUNITY): Payer: Self-pay | Admitting: Pharmacy Technician

## 2011-09-24 ENCOUNTER — Encounter: Payer: Self-pay | Admitting: *Deleted

## 2011-09-28 ENCOUNTER — Other Ambulatory Visit: Payer: Self-pay | Admitting: Physician Assistant

## 2011-09-29 ENCOUNTER — Other Ambulatory Visit: Payer: Self-pay | Admitting: Radiology

## 2011-09-29 ENCOUNTER — Telehealth (HOSPITAL_COMMUNITY): Payer: Self-pay | Admitting: Hematology & Oncology

## 2011-09-30 ENCOUNTER — Encounter (HOSPITAL_COMMUNITY): Payer: Self-pay

## 2011-09-30 ENCOUNTER — Ambulatory Visit (HOSPITAL_COMMUNITY)
Admission: RE | Admit: 2011-09-30 | Discharge: 2011-09-30 | Disposition: A | Discharge status: Home / Self Care | Payer: BC Managed Care – PPO | Source: Ambulatory Visit | Attending: Hematology & Oncology | Admitting: Hematology & Oncology

## 2011-09-30 VITALS — BP 116/76 | HR 66 | Temp 97.2°F | Resp 16

## 2011-09-30 DIAGNOSIS — C8589 Other specified types of non-Hodgkin lymphoma, extranodal and solid organ sites: Secondary | ICD-10-CM | POA: Insufficient documentation

## 2011-09-30 DIAGNOSIS — Z452 Encounter for adjustment and management of vascular access device: Secondary | ICD-10-CM | POA: Insufficient documentation

## 2011-09-30 DIAGNOSIS — C8590 Non-Hodgkin lymphoma, unspecified, unspecified site: Secondary | ICD-10-CM

## 2011-09-30 HISTORY — DX: Shortness of breath: R06.02

## 2011-09-30 MED ORDER — SODIUM BICARBONATE 4 % IV SOLN
INTRAVENOUS | Status: AC
  Filled 2011-09-30: qty 5

## 2011-09-30 MED ORDER — VANCOMYCIN HCL IN DEXTROSE 1-5 GM/200ML-% IV SOLN
INTRAVENOUS | Status: AC
  Filled 2011-09-30: qty 200

## 2011-09-30 MED ORDER — VANCOMYCIN HCL IN DEXTROSE 1-5 GM/200ML-% IV SOLN: 1000.0000 mg | Freq: Once | INTRAVENOUS | Status: DC

## 2011-09-30 MED ORDER — SODIUM CHLORIDE 0.9 % IV SOLN
INTRAVENOUS | Status: DC
  Administered 2011-09-30: 13:00:00 via INTRAVENOUS

## 2011-09-30 MED ORDER — MIDAZOLAM HCL 2 MG/2ML IJ SOLN
INTRAMUSCULAR | Status: AC
  Filled 2011-09-30: qty 2

## 2011-09-30 MED ORDER — LIDOCAINE HCL 1 % IJ SOLN
INTRAMUSCULAR | Status: AC
  Filled 2011-09-30: qty 20

## 2011-09-30 MED ORDER — FENTANYL CITRATE 0.05 MG/ML IJ SOLN
INTRAMUSCULAR | Status: AC
  Filled 2011-09-30: qty 2

## 2011-09-30 NOTE — H&P (Signed)
Walter [name] is an 66 y.o. male.   Chief Complaint: Lymphoma currently in remission.  Presents today for port a cath removal.  HPI: Clinical remission of lymphoma.  See note from recent oncology visit below :  Related encounter: Office Visit from 09/17/2011 in [facility]  CC:   Walter [name], M.D. Walter Punches, MD Walter [name], M.D.   DIAGNOSIS:  Diffuse non-Hodgkin lymphoma, clinical remission.   CURRENT THERAPY:  Observation.   INTERIM HISTORY:  Walter [name] comes in for his followup. He is still doing okay.  He is still working.  He is now over a year out from his chemotherapy.  I think we can probably get his Port-A-Cath taken out now.   He had a PET scan done.  This was done last week.  The PET scan did not show any evidence of recurrent disease.   He has had some occasional back discomfort.  He has had no change in bowel or bladder habits.  He said his blood sugars are doing okay. There has been no leg swelling.  He has had a little bit of a cough.   PHYSICAL EXAMINATION:  This is a well-developed, well-nourished white gentleman in no obvious distress.  Vital Signs:  Temperature of 96, 7, pulse 65, respiratory rate 20, blood pressure 103/73.  Weight is 208. Head and neck:  Normocephalic, atraumatic skull.  There are no ocular or oral lesions.  There are no palpable cervical or supraclavicular lymph nodes.  Lungs:  Clear bilaterally.  Cardiac:  Regular rate and rhythm with a normal S1 and S2.  There are no murmurs, rubs or bruits. Abdomen:  Soft with good bowel sounds.  There is no palpable abdominal mass.  There is no fluid wave.  There is no palpable hepatosplenomegaly. Back:  No tenderness over the spine, ribs, or hips.  Extremities:  No clubbing, cyanosis or edema.  Skin:  No rashes, ecchymosis or petechia. Neurologic:  No focal neurological deficits.   LABORATORY STUDIES:  White cell count 5.7, hemoglobin 13.7, hematocrit 30.3,  platelet count 128.   IMPRESSION:  Walter [name] is a 66 year old gentleman with non-Hodgkin lymphoma.  This was a diffuse cell lymphoma.  Unfortunately, with the limited biopsy material that we have, we cannot tell if this was a diffuse large cell or small cell subtype.  However, clinically it was behaving as an aggressive type lymphoma, so I did not feel that we had to put him through a major surgical procedure for biopsy and better specimen.   He is in remission.  He was treated as if he had an aggressive type of lymphoma.   We will go ahead and plan to get him back in another 4 months.  I really think that we can hold off on any PET scans unless he begins to have symptoms. He is at risk for recurrence so we have to be cautious and be aggressive with followup if there are any problems that might suggest recurrence.   Will get his Port-A-Cath taken out in the next week or so.       ______________________________ Walter [name], M.D. PRE/MEDQ  D:  09/17/2011  T:  09/18/2011  Job:  [id number]  Last signed by: Walter Macho, MD    [09/19/2011 8:54 PM]  Routing History...        Note from today's appointment :  Past Medical History  Diagnosis Date  . Lumbar spondylolysis   . Hypercholesteremia   .  Cardiomegaly   . Cancer     NHL  . Shortness of breath     Past Surgical History  Procedure Date  . None   . Portacath placement 10/2009    Via IR    Family History  Problem Relation Age of Onset  . Heart attack    . Cancer     Social History:  reports that he has never smoked. He does not have any smokeless tobacco history on file. He reports that he does not drink alcohol. His drug history not on file.  Allergies:  Allergies  Allergen Reactions  . Penicillins     itching   Results for Walter [name] (MRN [id number]) as of 09/30/2011 13:19  Ref. Range 09/17/2011 15:00  Sodium Latest Range: 135-145 mEq/L 137  Potassium Latest Range: 3.5-5.3 mEq/L 3.9  Chloride  Latest Range: 96-112 mEq/L 103  CO2 Latest Range: 19-32 mEq/L 28  BUN Latest Range: 6-23 mg/dL 13  Creat Latest Range: 0.50-1.35 mg/dL 7.82  Calcium Latest Range: 8.4-10.5 mg/dL 8.5  Glucose Latest Range: 70-99 mg/dL 956 (H)  Alkaline Phosphatase Latest Range: 39-117 U/L 93  Albumin Latest Range: 3.3-5.5 g/dL 3.8  AST Latest Range: 0-37 U/L 16  ALT Latest Range: 0-53 U/L 20  Total Protein Latest Range: 6.0-8.3 g/dL 5.8 (L)  Total Bilirubin Latest Range: 0.3-1.2 mg/dL 0.6  LDH Latest Range: 94-250 U/L 122  WBC Latest Range: 4.0-10.0 10e3/uL 5.7  RBC Latest Range: 4.22-5.81 MIL/uL 4.05 (L)  Hemoglobin Latest Range: 13.0-17.0 g/dL 21.3  HCT Latest Range: 39.0-52.0 % 38.3 (L)  MCV Latest Range: 78.0-100.0 fL 95  MCH Latest Range: 26.0-34.0 pg 33.8 (H)  MCHC Latest Range: 30.0-36.0 g/dL 08.6  RDW Latest Range: 11.5-15.5 % 12.6  Platelets Latest Range: 150-400 K/uL 128 (L)  NEUT% Latest Range: 39.0-75.0 % 46.0  LYMPH% Latest Range: 14.0-49.0 % 38.5  MONO% Latest Range: 0.0-14.0 % 12.2  EOS% Latest Range: 0.0-7.0 % 2.6  BASO% Latest Range: 0.0-2.0 % 0.7  BASO# Latest Range: 0.0-0.2 10e3/uL 0.0  NEUT# Latest Range: 1.7-7.7 K/uL 2.6  MONO# Latest Range: 0.1-0.9 10e3/uL 0.7  Eosinophils Absolute Latest Range: 0.0-0.5 10e3/uL 0.2  lymph# Latest Range: 0.9-3.3 10e3/uL 2.2     Review of Systems  Constitutional: Negative for fever, chills, malaise/fatigue and diaphoresis.  Respiratory: Positive for cough. Negative for hemoptysis, sputum production and shortness of breath.   Cardiovascular: Negative for chest pain, palpitations and leg swelling.  Gastrointestinal: Negative for heartburn, nausea, vomiting, abdominal pain, diarrhea and constipation.  Musculoskeletal: Positive for back pain. Negative for falls.  Neurological: Negative.   Psychiatric/Behavioral: Negative for depression and suicidal ideas.    Blood pressure 116/76, pulse 66, temperature 97.2 F (36.2 C), resp. rate 16, SpO2  97.00%. Physical Exam  Constitutional: He is oriented to person, place, and time. He appears well-developed and well-nourished. No distress.  HENT:  Head: Normocephalic and atraumatic.  Eyes: Pupils are equal, round, and reactive to light.  Cardiovascular: Normal rate, regular rhythm and normal heart sounds.  Exam reveals no gallop and no friction rub.   No murmur heard. Respiratory: Effort normal and breath sounds normal. No respiratory distress. He has no wheezes. He has no rales.  GI: Soft. Bowel sounds are normal.  Musculoskeletal: Normal range of motion. He exhibits no edema and no tenderness.       Right chest port site clean and dry  Neurological: He is alert and oriented to person, place, and time.  Skin: Skin is warm and  dry.  Psychiatric: He has a normal mood and affect. His behavior is normal. Judgment and thought content normal.     Assessment/Plan Talked with patient and family in regards to port a cath removal as no longer needed.  Procedure details and risks including but not limited to infection, bleeding, vessel damage and complications with moderate sedation discussed with the patient's apparent understanding.  Written consent obtained.  Recent CBC reviewed - no new labs necessary for port removal.   Wille Aubuchon D 09/30/2011, 1:10 PM

## 2011-09-30 NOTE — Procedures (Signed)
Successful RT IJ POWER PAC REMOVAL NO COMP STABLE

## 2011-10-07 ENCOUNTER — Other Ambulatory Visit: Payer: Self-pay | Admitting: *Deleted

## 2011-10-07 DIAGNOSIS — C8590 Non-Hodgkin lymphoma, unspecified, unspecified site: Secondary | ICD-10-CM

## 2011-10-07 MED ORDER — DICLOFENAC SODIUM 50 MG PO TBEC: 50.0000 mg | DELAYED_RELEASE_TABLET | Freq: Two times a day (BID) | ORAL | Status: DC

## 2011-10-08 ENCOUNTER — Encounter: Payer: Self-pay | Admitting: *Deleted

## 2011-10-12 ENCOUNTER — Other Ambulatory Visit: Payer: Self-pay | Admitting: *Deleted

## 2011-10-12 DIAGNOSIS — C8590 Non-Hodgkin lymphoma, unspecified, unspecified site: Secondary | ICD-10-CM

## 2011-10-12 DIAGNOSIS — M549 Dorsalgia, unspecified: Secondary | ICD-10-CM

## 2011-10-12 MED ORDER — DICLOFENAC SODIUM 50 MG PO TBEC: 50.0000 mg | DELAYED_RELEASE_TABLET | Freq: Two times a day (BID) | ORAL | Status: DC

## 2011-10-12 NOTE — Telephone Encounter (Signed)
Received refill request from [facility] for Voltaren. This is a chronic med for the pt. Refilled and faxed back to [phone].

## 2011-10-19 ENCOUNTER — Telehealth: Payer: Self-pay | Admitting: *Deleted

## 2011-10-19 NOTE — Telephone Encounter (Signed)
Pt's wife left message on voicemail asking if he needed to take antibiotics prior to his dental appt. His dentist wasn't sure since he is no longer receiving chemotherapy. Reviewed with Dr Myna Hidalgo. He doesn't need to take antibiotics if his port has been removed. Port was removed on 09/30/11 therefore no abx needed. Asked to call back if further questions.

## 2012-01-06 ENCOUNTER — Other Ambulatory Visit: Payer: Self-pay | Admitting: Hematology & Oncology

## 2012-01-06 DIAGNOSIS — C8590 Non-Hodgkin lymphoma, unspecified, unspecified site: Secondary | ICD-10-CM

## 2012-01-09 IMAGING — CR DG CHEST 2V
2 series · 2 of 2 positions shown · non-contrast
Comparison: 10/01/2009

CLINICAL DATA: Non-Hodgkins lymphoma.

CHEST - 2 VIEW

[w chest pa]
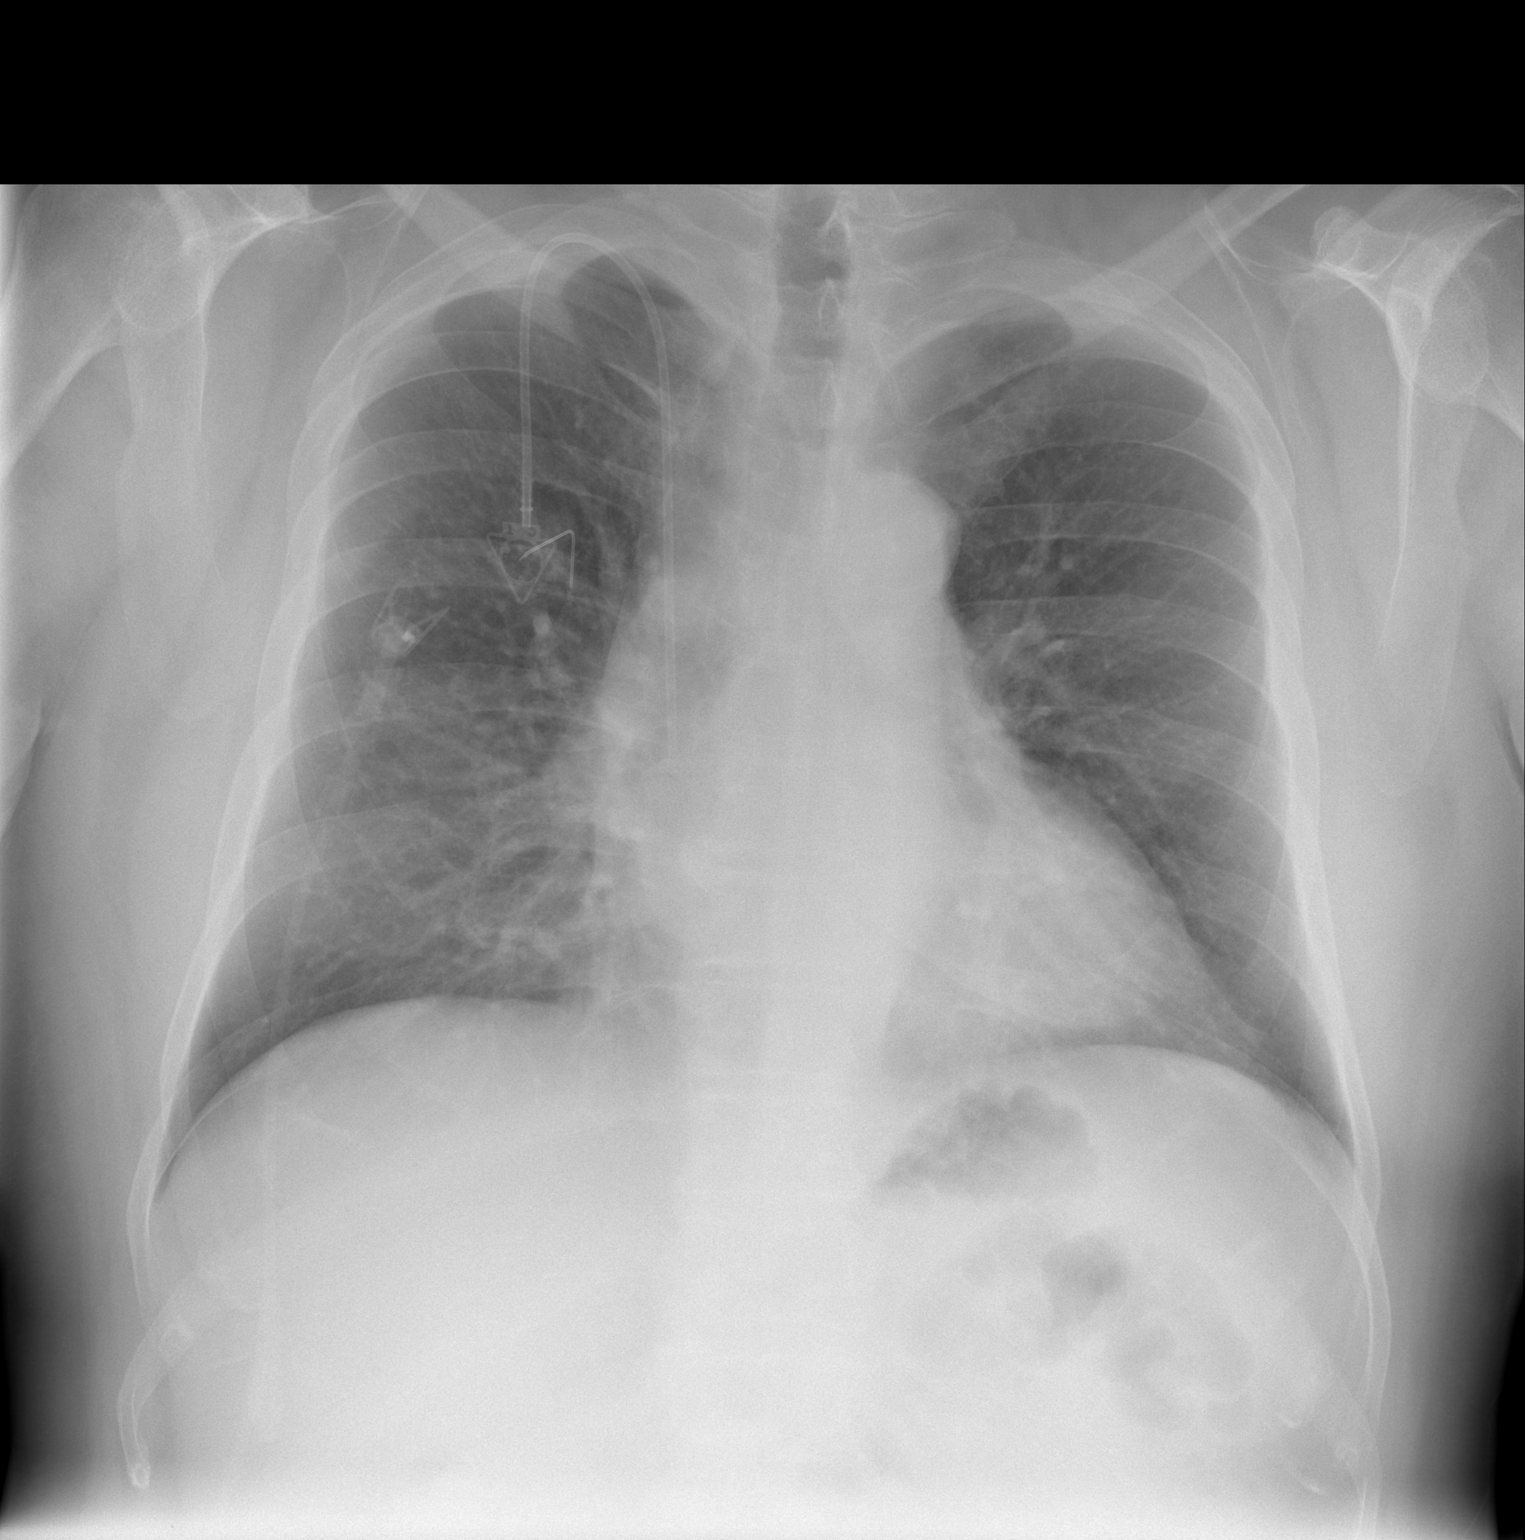

[w chest lat]
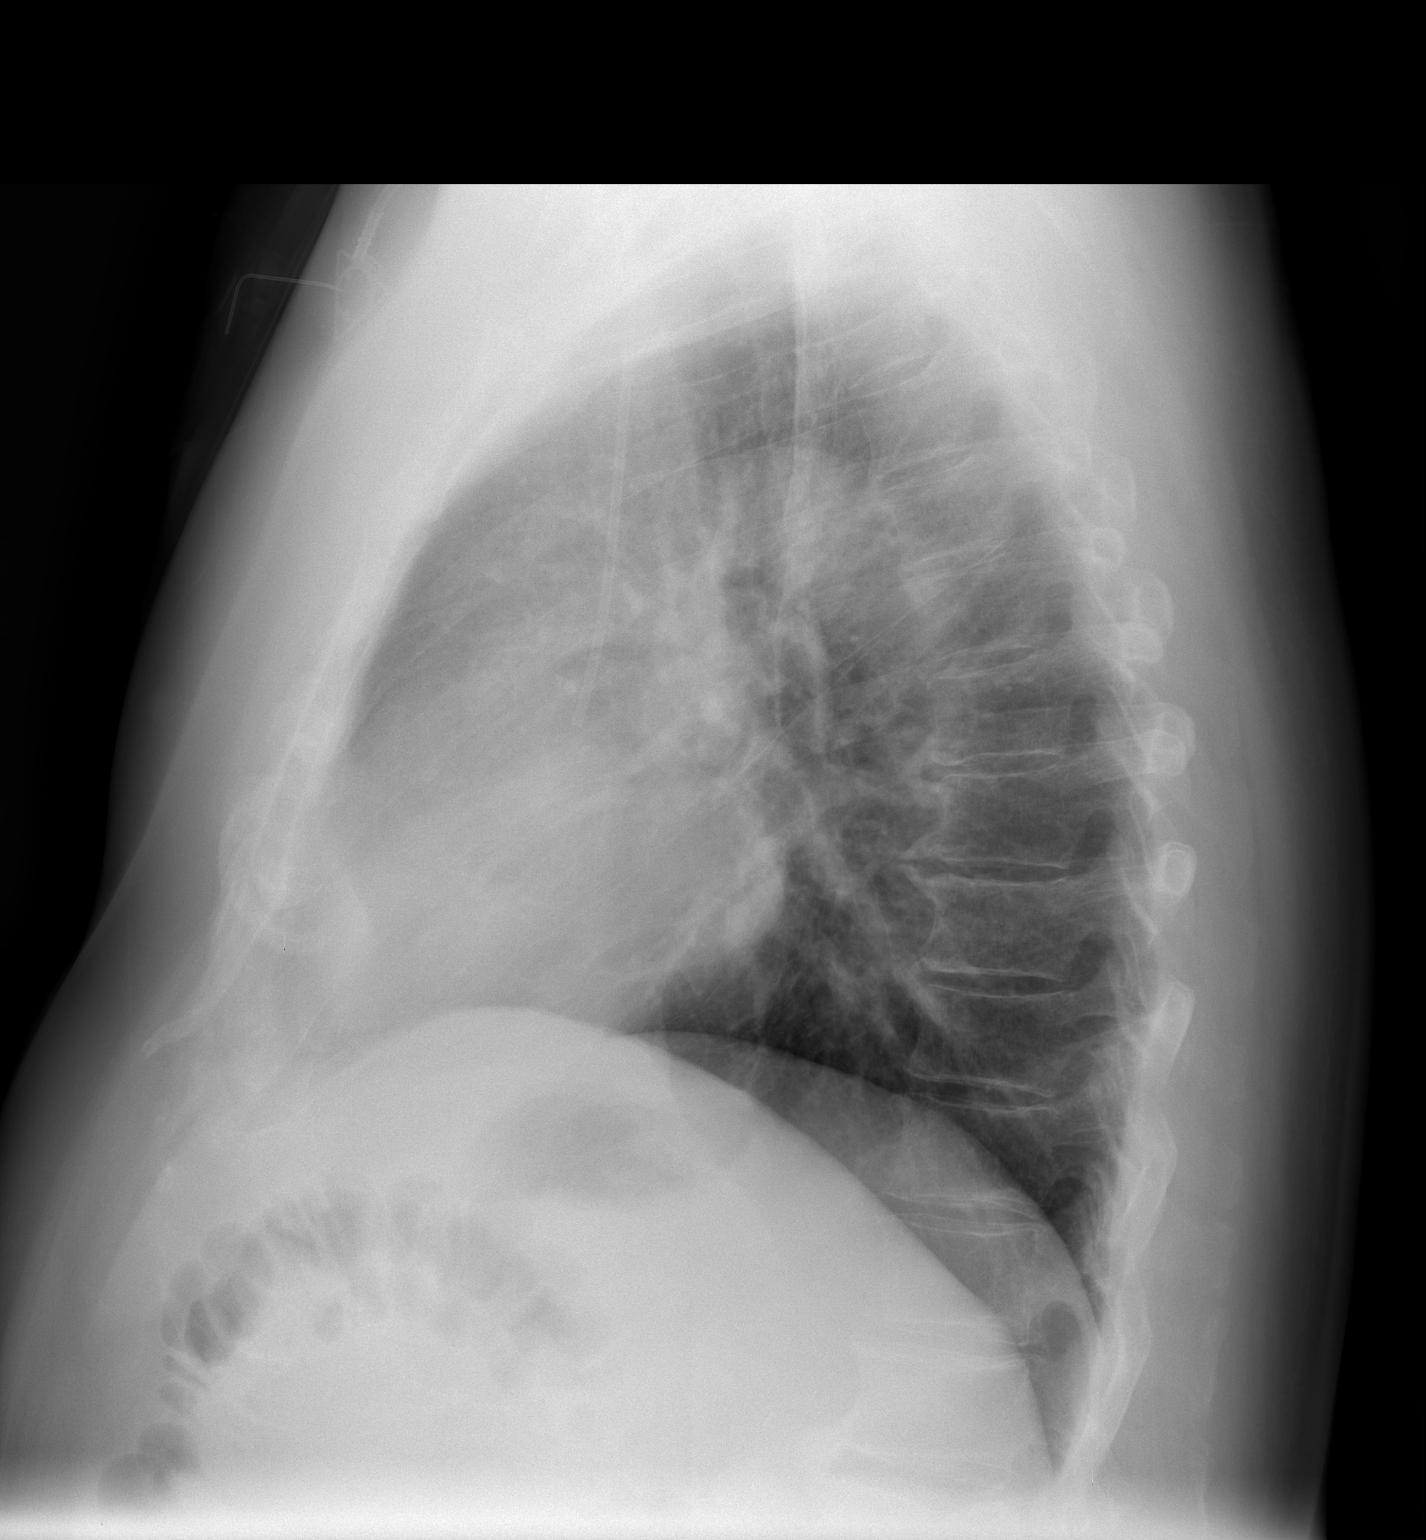

[2 of 2 positions shown; findings below may reference images not displayed]

FINDINGS: Right jugular Port-A-Cath tip is at the cavoatrial
junction in good position.

Heart size is upper normal.  Negative for heart failure.  Negative
for pneumonia or effusion.  Negative for mass or adenopathy.
IMPRESSION: No active cardiopulmonary disease.

## 2012-01-18 ENCOUNTER — Encounter (HOSPITAL_COMMUNITY)
Admission: RE | Admit: 2012-01-18 | Discharge: 2012-01-18 | Disposition: A | Discharge status: Home / Self Care | Payer: BC Managed Care – PPO | Source: Ambulatory Visit | Attending: Hematology & Oncology | Admitting: Hematology & Oncology

## 2012-01-18 ENCOUNTER — Encounter (HOSPITAL_COMMUNITY): Payer: Self-pay

## 2012-01-18 DIAGNOSIS — M899 Disorder of bone, unspecified: Secondary | ICD-10-CM | POA: Insufficient documentation

## 2012-01-18 DIAGNOSIS — R918 Other nonspecific abnormal finding of lung field: Secondary | ICD-10-CM | POA: Insufficient documentation

## 2012-01-18 DIAGNOSIS — C8590 Non-Hodgkin lymphoma, unspecified, unspecified site: Secondary | ICD-10-CM

## 2012-01-18 DIAGNOSIS — M549 Dorsalgia, unspecified: Secondary | ICD-10-CM | POA: Insufficient documentation

## 2012-01-18 DIAGNOSIS — R109 Unspecified abdominal pain: Secondary | ICD-10-CM | POA: Insufficient documentation

## 2012-01-18 DIAGNOSIS — C8589 Other specified types of non-Hodgkin lymphoma, extranodal and solid organ sites: Secondary | ICD-10-CM | POA: Insufficient documentation

## 2012-01-18 MED ORDER — FLUDEOXYGLUCOSE F - 18 (FDG) INJECTION
18.9000 | Freq: Once | INTRAVENOUS | Status: AC | PRN
  Administered 2012-01-18: 18.9 via INTRAVENOUS

## 2012-01-21 ENCOUNTER — Ambulatory Visit (HOSPITAL_BASED_OUTPATIENT_CLINIC_OR_DEPARTMENT_OTHER): Payer: BC Managed Care – PPO | Admitting: Hematology & Oncology

## 2012-01-21 ENCOUNTER — Other Ambulatory Visit (HOSPITAL_BASED_OUTPATIENT_CLINIC_OR_DEPARTMENT_OTHER): Payer: BC Managed Care – PPO | Admitting: Lab

## 2012-01-21 VITALS — BP 123/70 | HR 67 | Temp 97.8°F | Resp 16 | Ht 70.0 in | Wt 203.0 lb

## 2012-01-21 DIAGNOSIS — C8590 Non-Hodgkin lymphoma, unspecified, unspecified site: Secondary | ICD-10-CM

## 2012-01-21 DIAGNOSIS — C8589 Other specified types of non-Hodgkin lymphoma, extranodal and solid organ sites: Secondary | ICD-10-CM

## 2012-01-21 LAB — CBC WITH DIFFERENTIAL (CANCER CENTER ONLY)
BASO#: 0.1 10*3/uL (ref 0.0–0.2)
BASO%: 0.8 % (ref 0.0–2.0)
HCT: 43.3 % (ref 38.7–49.9)
HGB: 15.5 g/dL (ref 13.0–17.1)
LYMPH#: 2.2 10*3/uL (ref 0.9–3.3)
LYMPH%: 36.4 % (ref 14.0–48.0)
MCHC: 35.8 g/dL (ref 32.0–35.9)
MCV: 94 fL (ref 82–98)
MONO#: 0.7 10*3/uL (ref 0.1–0.9)
NEUT%: 48.3 % (ref 40.0–80.0)
RDW: 12 % (ref 11.1–15.7)

## 2012-01-22 LAB — COMPREHENSIVE METABOLIC PANEL
ALT: 16 U/L (ref 0–53)
BUN: 14 mg/dL (ref 6–23)
CO2: 29 mEq/L (ref 19–32)
Calcium: 9.1 mg/dL (ref 8.4–10.5)
Chloride: 102 mEq/L (ref 96–112)
Creatinine, Ser: 0.72 mg/dL (ref 0.50–1.35)
Total Bilirubin: 0.5 mg/dL (ref 0.3–1.2)

## 2012-01-22 LAB — LACTATE DEHYDROGENASE: LDH: 113 U/L (ref 94–250)

## 2012-01-22 LAB — VITAMIN D 25 HYDROXY (VIT D DEFICIENCY, FRACTURES): Vit D, 25-Hydroxy: 42 ng/mL (ref 30–89)

## 2012-01-26 ENCOUNTER — Telehealth: Payer: Self-pay | Admitting: *Deleted

## 2012-01-26 NOTE — Telephone Encounter (Signed)
Called patients wife to let her know that patients labs were all good and to keep taking Vitamin D,.

## 2012-01-26 NOTE — Telephone Encounter (Signed)
Message copied by Anselm Jungling on Wed Jan 26, 2012 10:14 AM ------      Message from: Walter [name]      Created: Tue Jan 25, 2012  8:31 PM       Call - labs are all ok!!  Keep taking Vit D. Cindee Lame

## 2012-02-04 NOTE — Progress Notes (Signed)
CC:   [name], M.D. Bethann Punches, MD  DIAGNOSIS:  Diffuse non-Hodgkin's lymphoma.  CURRENT THERAPY:  Observation.  INTERIM HISTORY:  Mr. Peercy comes in for his followup.  He has been doing okay.  He had a decent summer.  He has been working.  He is trying to cut back a little on work if he could.  He does have some back discomfort.  He has had no problems with bowels or bladder.  He has had no cough.  He has had no fevers.  We did go ahead and do a followup PET scan on him.  He is at significant risk for recurrent disease.  We need to check him periodically so that we can try to catch any active lymphoma.  His PET scan was done on 09/17.  The PET scan showed 2 noncalcified pulmonary nodules in the right upper lung which are stable.  No obviously active lymphoma was noted.  PHYSICAL EXAMINATION:  General:  This is a well developed, well nourished white gentleman in no obvious distress.  Vital signs: Temperature of 97.8, pulse 67, respiratory rate 18, blood pressure 123/70.  Weight is 203.  Head and neck:  Normocephalic, atraumatic skull.  There are no ocular or oral lesions.  There are no palpable cervical or supraclavicular lymph nodes.  Lungs:  Clear bilaterally. Cardiac:  Regular rate and rhythm with a normal S1 and S2.  There are no murmurs, rubs or bruits.  Abdomen:  Soft with good bowel sounds.  There is no palpable abdominal mass.  There is no fluid wave.  There is no palpable hepatosplenomegaly.  Back:  Shows some slight tenderness in the lumbosacral spine.  He has some slight muscle spasm in the lumbosacral spine.  Extremities:  Shows no clubbing, cyanosis or edema.  Neurologic: No focal neurological deficit.  LABORATORY STUDIES:  White cell count is 6.2, hemoglobin 15.5, hematocrit 43.3, platelet count 151.  His LDH is 111.  Vitamin D is 42. BUN is 14, creatinine 0.72.  Calcium is 9.1 with an albumin of 4.0.  IMPRESSION:  Mr. Monaco is a 66 year old  gentleman with diffuse non- Hodgkin's lymphoma.  He received his chemotherapy.  This was completed 2 years ago.  He then received intrathecal chemotherapy to prevent CNS relapse.  He completed this, I think back in March of 2012.  He has no obviously active disease.  We have to be very aggressive with our surveillance.  We need to make sure that we follow him up closely. With his lymphoma, there is that risk of relapse and we need to make sure that we catch this quickly.  We will go ahead and plan to get him back to see [place] in another I think 4 months or so.    ______________________________ Josph Macho, M.D. PRE/MEDQ  D:  02/04/2012  T:  02/04/2012  Job:  [id number]

## 2012-02-04 NOTE — Progress Notes (Signed)
This office note has been dictated.

## 2012-02-07 NOTE — Letter (Signed)
February 04, 2012    [facility]  NAME:  Walter [name], Walter [name] MRN:  [id number] DOB:  December 08, 1945  Dear Walter [name] or Madam:  [name] is a patient at the [facility] in [place], [place].  [name] is a 66 year old gentleman.  We initially saw him probably about 2-1/2 years ago.  He presented with non- Hodgkin's lymphoma.  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  He was treated with aggressive chemotherapy.  He actually also got chemotherapy to his spinal fluid.  We are monitoring him closely now.  He does have some chemotherapy complications.  He does have some memory issues.  He does have continued fatigue.  He has somewhat chronic arthralgias particularly back discomfort.  Thankfully we are not seeing any active lymphoma but we are aggressively in an active surveillance mode so that we can detect any recurrence of his lymphoma quickly.  Unfortunately [name] may have permanent complications from his chemotherapy.  These complications from chemotherapy are well documented in the oncology literature.  Again, [name] gladly served his country in the [place] war when asked to.  He put his life on the line.  Unfortunately he was exposed to toxins that at the time were not known to be that significant.  He definitely was exposed to [employer] while in [place].  Again, I truly believe that his lymphoma is a direct consequence of his service to the [place].  Again, I expect that the [place] will help take care of [name] and his family.  He clearly deserves 100% disability by the [place] for this lymphoma and the complications which he has.  Again, these complications are unavoidable as these are the most active  treatments for his lymphoma.  I gratefully appreciate all the assistance that you can provide to [name] and his family.  I know that the [place] will do the right thing for those who have served their country.  If any further medical information is needed, please feel free to let me know.  Respectfully yours,   Josph Macho, M.D.  PRE/MEDQ  D:  02/04/2012  T:  02/04/2012  Job:  [id number]

## 2012-02-11 ENCOUNTER — Encounter: Payer: Self-pay | Admitting: *Deleted

## 2012-05-31 ENCOUNTER — Other Ambulatory Visit: Payer: Self-pay | Admitting: *Deleted

## 2012-05-31 ENCOUNTER — Telehealth: Payer: Self-pay | Admitting: Hematology & Oncology

## 2012-05-31 DIAGNOSIS — M549 Dorsalgia, unspecified: Secondary | ICD-10-CM

## 2012-05-31 DIAGNOSIS — C8590 Non-Hodgkin lymphoma, unspecified, unspecified site: Secondary | ICD-10-CM

## 2012-05-31 MED ORDER — DICLOFENAC SODIUM 50 MG PO TBEC: 50.0000 mg | DELAYED_RELEASE_TABLET | Freq: Two times a day (BID) | ORAL | Status: DC

## 2012-05-31 NOTE — Telephone Encounter (Signed)
Pt decided to get PET and MD in february. They are aware of 2-20 PET and 2-26 MD appointments and to be NPO 6 hrs

## 2012-06-22 ENCOUNTER — Encounter (HOSPITAL_COMMUNITY)
Admission: RE | Admit: 2012-06-22 | Discharge: 2012-06-22 | Disposition: A | Discharge status: Home / Self Care | Payer: Medicare Other | Source: Ambulatory Visit | Attending: Hematology & Oncology | Admitting: Hematology & Oncology

## 2012-06-22 DIAGNOSIS — C8589 Other specified types of non-Hodgkin lymphoma, extranodal and solid organ sites: Secondary | ICD-10-CM | POA: Insufficient documentation

## 2012-06-22 DIAGNOSIS — R918 Other nonspecific abnormal finding of lung field: Secondary | ICD-10-CM | POA: Insufficient documentation

## 2012-06-22 DIAGNOSIS — M899 Disorder of bone, unspecified: Secondary | ICD-10-CM | POA: Insufficient documentation

## 2012-06-22 LAB — GLUCOSE, CAPILLARY: Glucose-Capillary: 108 mg/dL — ABNORMAL HIGH (ref 70–99)

## 2012-06-22 MED ORDER — FLUDEOXYGLUCOSE F - 18 (FDG) INJECTION
18.2000 | Freq: Once | INTRAVENOUS | Status: AC | PRN
  Administered 2012-06-22: 18.2 via INTRAVENOUS

## 2012-06-26 ENCOUNTER — Other Ambulatory Visit: Payer: Self-pay | Admitting: *Deleted

## 2012-06-26 ENCOUNTER — Ambulatory Visit (HOSPITAL_BASED_OUTPATIENT_CLINIC_OR_DEPARTMENT_OTHER): Payer: BC Managed Care – PPO | Admitting: Hematology & Oncology

## 2012-06-26 ENCOUNTER — Other Ambulatory Visit (HOSPITAL_BASED_OUTPATIENT_CLINIC_OR_DEPARTMENT_OTHER): Payer: BC Managed Care – PPO | Admitting: Lab

## 2012-06-26 VITALS — BP 116/72 | HR 67 | Temp 97.3°F | Resp 18 | Ht 70.0 in | Wt 211.0 lb

## 2012-06-26 DIAGNOSIS — C8589 Other specified types of non-Hodgkin lymphoma, extranodal and solid organ sites: Secondary | ICD-10-CM

## 2012-06-26 DIAGNOSIS — M549 Dorsalgia, unspecified: Secondary | ICD-10-CM

## 2012-06-26 DIAGNOSIS — C8590 Non-Hodgkin lymphoma, unspecified, unspecified site: Secondary | ICD-10-CM

## 2012-06-26 LAB — CBC WITH DIFFERENTIAL (CANCER CENTER ONLY)
BASO%: 0.5 % (ref 0.0–2.0)
Eosinophils Absolute: 0.2 10*3/uL (ref 0.0–0.5)
LYMPH#: 1.8 10*3/uL (ref 0.9–3.3)
LYMPH%: 31.3 % (ref 14.0–48.0)
MONO#: 0.6 10*3/uL (ref 0.1–0.9)
NEUT#: 3.2 10*3/uL (ref 1.5–6.5)
Platelets: 148 10*3/uL (ref 145–400)
RBC: 4.73 10*6/uL (ref 4.20–5.70)
RDW: 12.3 % (ref 11.1–15.7)
WBC: 5.8 10*3/uL (ref 4.0–10.0)

## 2012-06-26 LAB — LACTATE DEHYDROGENASE: LDH: 118 U/L (ref 94–250)

## 2012-06-26 LAB — COMPREHENSIVE METABOLIC PANEL
ALT: 15 U/L (ref 0–53)
Albumin: 4.1 g/dL (ref 3.5–5.2)
CO2: 29 mEq/L (ref 19–32)
Chloride: 104 mEq/L (ref 96–112)
Glucose, Bld: 100 mg/dL — ABNORMAL HIGH (ref 70–99)
Potassium: 4.7 mEq/L (ref 3.5–5.3)
Sodium: 139 mEq/L (ref 135–145)
Total Bilirubin: 0.5 mg/dL (ref 0.3–1.2)
Total Protein: 6.3 g/dL (ref 6.0–8.3)

## 2012-06-26 MED ORDER — DICLOFENAC SODIUM 50 MG PO TBEC: 50.0000 mg | DELAYED_RELEASE_TABLET | Freq: Two times a day (BID) | ORAL | Status: DC

## 2012-06-26 NOTE — Telephone Encounter (Signed)
Received refill request from [facility] for Voltaren. This is a chronic med for the pt. Sent via e-rx.Marland Kitchen

## 2012-06-26 NOTE — Progress Notes (Signed)
This office note has been dictated.

## 2012-06-27 ENCOUNTER — Telehealth: Payer: Self-pay | Admitting: Hematology & Oncology

## 2012-06-27 NOTE — Telephone Encounter (Addendum)
Message copied by Cathi Roan on Tue Jun 27, 2012  3:11 PM ------      Message from: Arlan Organ R      Created: Mon Jun 26, 2012  6:38 PM       Call - labs are great!!!  Cindee Lame  06-27-12  Called patient and on home phone and left above MD message, advised to call office if any questions. Lupita Raider LPN ------

## 2012-06-27 NOTE — Progress Notes (Signed)
CC:   Walter [name], M.D. Bethann Punches, MD  DIAGNOSIS:  Diffuse large cell non-Hodgkin's lymphoma.  CURRENT THERAPY:  Observation.  INTERIM HISTORY:  [name] comes in for his followup.  We last saw back in September.  He is doing okay.  Unfortunately, he was laid off from his job with the [employer].  This is somewhat irritating to me, as he has been with them for such a long time.  He did have a followup PET scan.  This was just done last week.  The PET scan did not show any evidence of recurrent lymphoma.  Again, he had a couple stable pulmonary nodules, 1 in the right upper lobe, 1 in the left lower lobe.  He has had no abdominal pain.  There is no problem with bowels or bladder.  He has had some issues with his left knee.  He does not have an orthopedic surgeon.  I said I can make a recommendation if he wants me to.  He said he would hold off on this for now.  His blood sugars have been doing okay.  His wife is going to have knee surgery in a couple weeks.  He will help take care of her.  PHYSICAL EXAMINATION:  General:  This is a well-developed, well- nourished white gentleman in no obvious distress.  Vital signs:  Show temperature of 97.3, pulse 67, respiratory rate 18, blood pressure 116/72.  Weight is 211 pounds.  Head and neck:  Shows a normocephalic, atraumatic skull.  There are no ocular or oral lesions.  There are no palpable cervical or supraclavicular lymph nodes.  Lungs:  Clear bilaterally.  Cardiac:  Regular rate and rhythm with a normal S1, S2. There are no murmurs, rubs or bruits.  Abdomen:  Soft with good bowel sounds.  There is no palpable abdominal mass.  There is no fluid wave. There is no palpable hepatosplenomegaly.  Extremities:  Show no clubbing, cyanosis or edema.  Neurological:  Shows no focal neurological deficits.  LABORATORY STUDIES:  White cell count is 5.8, hemoglobin 16, hematocrit 45, platelet count 148.  IMPRESSION:  [name] is  a very nice 67 year old gentleman with history of diffuse large cell lymphoma.  We went ahead and treated him with R-CHOP.  He completed this 2 years ago in I think actually October of 2011.  He then underwent intrathecal chemotherapy.  He completed this in March of 2012.  At this point in time, we do not need to do any further scans on him.  I will only do scans if he has symptoms or his lab work looks unusual.  We can now get him back in another 6 months.    ______________________________ Josph Macho, M.D. PRE/MEDQ  D:  06/26/2012  T:  06/27/2012  Job:  [id number]

## 2012-06-28 ENCOUNTER — Other Ambulatory Visit: Payer: BC Managed Care – PPO | Admitting: Lab

## 2012-06-28 ENCOUNTER — Ambulatory Visit: Payer: BC Managed Care – PPO | Admitting: Hematology & Oncology

## 2012-06-29 ENCOUNTER — Ambulatory Visit: Payer: BC Managed Care – PPO | Admitting: Hematology & Oncology

## 2012-06-29 ENCOUNTER — Other Ambulatory Visit: Payer: BC Managed Care – PPO | Admitting: Lab

## 2012-07-14 ENCOUNTER — Ambulatory Visit: Payer: BC Managed Care – PPO | Admitting: Hematology & Oncology

## 2012-07-14 ENCOUNTER — Other Ambulatory Visit: Payer: BC Managed Care – PPO | Admitting: Lab

## 2012-10-20 NOTE — Telephone Encounter (Signed)
e

## 2012-12-27 ENCOUNTER — Other Ambulatory Visit (HOSPITAL_BASED_OUTPATIENT_CLINIC_OR_DEPARTMENT_OTHER): Payer: Medicare Other | Admitting: Lab

## 2012-12-27 ENCOUNTER — Ambulatory Visit (HOSPITAL_BASED_OUTPATIENT_CLINIC_OR_DEPARTMENT_OTHER): Payer: Medicare Other | Admitting: Hematology & Oncology

## 2012-12-27 VITALS — BP 129/72 | HR 64 | Temp 97.8°F | Resp 18 | Ht 70.0 in | Wt 199.0 lb

## 2012-12-27 DIAGNOSIS — C8590 Non-Hodgkin lymphoma, unspecified, unspecified site: Secondary | ICD-10-CM

## 2012-12-27 DIAGNOSIS — C8589 Other specified types of non-Hodgkin lymphoma, extranodal and solid organ sites: Secondary | ICD-10-CM

## 2012-12-27 LAB — COMPREHENSIVE METABOLIC PANEL
AST: 18 U/L (ref 0–37)
Albumin: 4.2 g/dL (ref 3.5–5.2)
BUN: 16 mg/dL (ref 6–23)
Calcium: 9 mg/dL (ref 8.4–10.5)
Chloride: 106 mEq/L (ref 96–112)
Creatinine, Ser: 0.62 mg/dL (ref 0.50–1.35)
Glucose, Bld: 89 mg/dL (ref 70–99)

## 2012-12-27 LAB — CBC WITH DIFFERENTIAL (CANCER CENTER ONLY)
BASO#: 0 10*3/uL (ref 0.0–0.2)
EOS%: 2.3 % (ref 0.0–7.0)
Eosinophils Absolute: 0.1 10*3/uL (ref 0.0–0.5)
HCT: 44.9 % (ref 38.7–49.9)
HGB: 15.4 g/dL (ref 13.0–17.1)
MCH: 33.6 pg — ABNORMAL HIGH (ref 28.0–33.4)
MCHC: 34.3 g/dL (ref 32.0–35.9)
MONO%: 10.3 % (ref 0.0–13.0)
NEUT%: 57.9 % (ref 40.0–80.0)
RBC: 4.58 10*6/uL (ref 4.20–5.70)

## 2012-12-27 NOTE — Progress Notes (Signed)
This office note has been dictated.

## 2012-12-28 ENCOUNTER — Telehealth: Payer: Self-pay | Admitting: Oncology

## 2012-12-28 NOTE — Telephone Encounter (Addendum)
Message copied by Lacie Draft on Thu Dec 28, 2012  4:38 PM ------      Message from: Arlan Organ R      Created: Wed Dec 27, 2012  9:05 PM       Call - labs look good.  Blood sugar is ok!!  Cindee Lame ------Spoke with wife.

## 2012-12-28 NOTE — Progress Notes (Signed)
CC:   Bethann Punches, MD [name], M.D.  DIAGNOSIS:  Diffuse large cell non-Hodgkin lymphoma, clinical remission.  CURRENT THERAPY:  Observation.  INTERIM HISTORY:  Mr. Vanscyoc comes in for followup.  We see him every 6 months.  He had a PET scan done back in February.  Everything looked fine with no evidence of recurrent disease.  He is still retired.  He is enjoying his retirement.  He has had no problems with cough or shortness of breath.  He has had some problems with his left knee.  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  He has had no change in bowel or bladder habits.  He has had no rashes.  His blood sugars seem to be doing fairly well.  He continues to take his aspirin.  PHYSICAL EXAMINATION:  General:  This is a well-developed, well- nourished white gentleman in no obvious distress.  Vital signs: Temperature of 97.8, pulse 64, respiratory rate 18, blood pressure 129/72.  Weight is 199.  Head and neck:  Normocephalic, atraumatic skull.  There are no ocular or oral lesions.  There are no palpable cervical or supraclavicular lymph nodes.  Lungs:  Clear bilaterally. Cardiac:  Regular rate and rhythm with a normal S1 and S2.  There are no murmurs, rubs or bruits.  Abdomen:  Soft.  He has good bowel sounds. There is no fluid wave.  There is no palpable hepatosplenomegaly. Extremities.  No clubbing, cyanosis or edema.  Neurologic:  No focal neurological deficits.  LABORATORY STUDIES:  White cell count is 5.2, hemoglobin 15.4, hematocrit 44.9, platelet count is 128.  MCV is 98.  IMPRESSION:  Mr. Brookens is a very nice 68 year old gentleman with a history of diffuse large-cell lymphoma.  We went ahead and treated him with chemotherapy with R-CHOP.  He tolerated this very well.  He also got intrathecal chemotherapy, as I felt that he was at high risk for a central nervous system relapse.  All of his treatments were completed back in March 2012.  We  will continue to follow him along every 6 months.    ______________________________ Josph Macho, M.D. PRE/MEDQ  D:  12/27/2012  T:  12/28/2012  Job:  [id number]

## 2013-06-18 ENCOUNTER — Telehealth: Payer: Self-pay | Admitting: Hematology & Oncology

## 2013-06-18 NOTE — Telephone Encounter (Signed)
Tammy cx 2-18 said would call back to reschedule.

## 2013-06-20 ENCOUNTER — Telehealth: Payer: Self-pay | Admitting: Hematology & Oncology

## 2013-06-20 ENCOUNTER — Ambulatory Visit: Payer: Medicare Other | Admitting: Hematology & Oncology

## 2013-06-20 ENCOUNTER — Other Ambulatory Visit: Payer: Medicare Other | Admitting: Lab

## 2013-06-20 NOTE — Telephone Encounter (Signed)
Pt rescheduled to 3-19

## 2013-06-29 ENCOUNTER — Other Ambulatory Visit: Payer: Medicare Other | Admitting: Lab

## 2013-06-29 ENCOUNTER — Ambulatory Visit: Payer: Medicare Other | Admitting: Hematology & Oncology

## 2013-07-19 ENCOUNTER — Ambulatory Visit (HOSPITAL_BASED_OUTPATIENT_CLINIC_OR_DEPARTMENT_OTHER): Payer: Medicare Other | Admitting: Hematology & Oncology

## 2013-07-19 ENCOUNTER — Other Ambulatory Visit (HOSPITAL_BASED_OUTPATIENT_CLINIC_OR_DEPARTMENT_OTHER): Payer: Medicare Other | Admitting: Lab

## 2013-07-19 VITALS — BP 134/88 | HR 63 | Resp 18 | Wt 201.0 lb

## 2013-07-19 DIAGNOSIS — C8590 Non-Hodgkin lymphoma, unspecified, unspecified site: Secondary | ICD-10-CM

## 2013-07-19 DIAGNOSIS — C8589 Other specified types of non-Hodgkin lymphoma, extranodal and solid organ sites: Secondary | ICD-10-CM

## 2013-07-19 LAB — CBC WITH DIFFERENTIAL (CANCER CENTER ONLY)
BASO#: 0 10*3/uL (ref 0.0–0.2)
BASO%: 0.6 % (ref 0.0–2.0)
EOS%: 2.5 % (ref 0.0–7.0)
Eosinophils Absolute: 0.2 10*3/uL (ref 0.0–0.5)
HEMATOCRIT: 45.5 % (ref 38.7–49.9)
HGB: 15.9 g/dL (ref 13.0–17.1)
LYMPH#: 2 10*3/uL (ref 0.9–3.3)
LYMPH%: 28.1 % (ref 14.0–48.0)
MCH: 33.3 pg (ref 28.0–33.4)
MCHC: 34.9 g/dL (ref 32.0–35.9)
MCV: 95 fL (ref 82–98)
MONO#: 0.7 10*3/uL (ref 0.1–0.9)
MONO%: 9.4 % (ref 0.0–13.0)
NEUT#: 4.3 10*3/uL (ref 1.5–6.5)
NEUT%: 59.4 % (ref 40.0–80.0)
Platelets: 151 10*3/uL (ref 145–400)
RBC: 4.78 10*6/uL (ref 4.20–5.70)
RDW: 13.1 % (ref 11.1–15.7)
WBC: 7.2 10*3/uL (ref 4.0–10.0)

## 2013-07-19 LAB — CMP (CANCER CENTER ONLY)
ALK PHOS: 90 U/L — AB (ref 26–84)
ALT: 23 U/L (ref 10–47)
AST: 20 U/L (ref 11–38)
Albumin: 3.9 g/dL (ref 3.3–5.5)
BILIRUBIN TOTAL: 0.8 mg/dL (ref 0.20–1.60)
BUN, Bld: 17 mg/dL (ref 7–22)
CO2: 32 mEq/L (ref 18–33)
CREATININE: 0.6 mg/dL (ref 0.6–1.2)
Calcium: 9.1 mg/dL (ref 8.0–10.3)
Chloride: 102 mEq/L (ref 98–108)
Glucose, Bld: 96 mg/dL (ref 73–118)
Potassium: 4.4 mEq/L (ref 3.3–4.7)
Sodium: 141 mEq/L (ref 128–145)
Total Protein: 7.1 g/dL (ref 6.4–8.1)

## 2013-07-19 NOTE — Progress Notes (Signed)
Hematology and Oncology Follow Up Visit  Walter [name] [id number] 10-29-1945 68 y.o. 07/19/2013   Principle Diagnosis:   Diffuse large cell non-Hodgkin's lymphoma-clinical remission  Current Therapy:    Observation     Interim History:  [name] is back for followup. We see him every 6 months. He's doing pretty well. Usually had no specific complaints. He is diabetic but watching this closely.  He's had some pain issues. There's been chronic. I told him that the chemotherapy therapy that he took may increase his arthralgias and myalgias.  He's had no fever. He got to the wintertime without any problems with cough or fever.  Is no bleeding. He's had no change in bowel bladder habits.  Medications: Current outpatient prescriptions:Ascorbic Acid (VITAMIN C) 1000 MG tablet, Take 1,000 mg by mouth daily., Disp: , Rfl: ;  aspirin EC 81 MG tablet, Take 81 mg by mouth daily., Disp: , Rfl: ;  atorvastatin (LIPITOR) 20 MG tablet, Take 20 mg by mouth daily. , Disp: , Rfl: ;  Cholecalciferol (VITAMIN D) 2000 UNITS tablet, Take 2,000 Units by mouth daily., Disp: , Rfl: ;  loratadine (CLARITIN) 10 MG tablet, Take 10 mg by mouth daily. , Disp: , Rfl:  Multiple Vitamins-Minerals (MULTIVITAMIN PO), Take by mouth every morning., Disp: , Rfl: ;  naproxen sodium (ANAPROX) 220 MG tablet, Take 220 mg by mouth 2 (two) times daily with a meal., Disp: , Rfl: ;  traMADol (ULTRAM) 50 MG tablet, TAKE 1 TO 2 TABLETS BY MOUTH EVERY 6 HOURS AS NEEDED BACK PAIN, Disp: 60 tablet, Rfl: 3  Allergies:  Allergies  Allergen Reactions  . Penicillins     itching    Past Medical History, Surgical history, Social history, and Family History were reviewed and updated.  Review of Systems: As above  Physical Exam:  weight is 201 lb (91.173 kg). His blood pressure is 134/88 and his pulse is 63. His respiration is 18.   Well-developed and well-nourished. No adenopathy is noted. Lungs are clear. Cardiac exam regular  in rhythm. Abdomen soft. There is no palpable abdominal mass. There is no palpable liver or spleen tip. Back exam no tenderness. Extremities no clubbing cyanosis or edema. Has good strength. He has good range of motion of his joints. I don't see much in the way of joint swelling. Skin exam no rashes. Neurological exam negative.  Lab Results  Component Value Date   WBC 7.2 07/19/2013   HGB 15.9 07/19/2013   HCT 45.5 07/19/2013   MCV 95 07/19/2013   PLT 151 07/19/2013     Chemistry      Component Value Date/Time   NA 141 07/19/2013 0806   NA 141 12/27/2012 0749   K 4.4 07/19/2013 0806   K 4.4 12/27/2012 0749   CL 102 07/19/2013 0806   CL 106 12/27/2012 0749   CO2 32 07/19/2013 0806   CO2 29 12/27/2012 0749   BUN 17 07/19/2013 0806   BUN 16 12/27/2012 0749   CREATININE 0.6 07/19/2013 0806   CREATININE 0.62 12/27/2012 0749      Component Value Date/Time   CALCIUM 9.1 07/19/2013 0806   CALCIUM 9.0 12/27/2012 0749   ALKPHOS 90* 07/19/2013 0806   ALKPHOS 84 12/27/2012 0749   AST 20 07/19/2013 0806   AST 18 12/27/2012 0749   ALT 23 07/19/2013 0806   ALT 17 12/27/2012 0749   BILITOT 0.80 07/19/2013 0806   BILITOT 0.6 12/27/2012 0749  Impression and Plan: Walter [name] is a 68 year old gentleman with diffuse large cell followup. He underwent systemic chemotherapy with R.-CHOP. He also got intrathecal chemotherapy along was high-dose methotrexate for CNS prophylaxis.  He. All his treatments 3 years ago in March of 2012.  We'll plan to get her back in 6 months. I do not see need for any scans.   [name], MD 3/19/20159:20 AM3

## 2013-11-18 ENCOUNTER — Emergency Department (HOSPITAL_COMMUNITY): Payer: Medicare Other

## 2013-11-18 ENCOUNTER — Emergency Department (HOSPITAL_COMMUNITY)
Admission: EM | Admit: 2013-11-18 | Discharge: 2013-11-18 | Disposition: A | Discharge status: Home / Self Care | Payer: Medicare Other | Attending: Emergency Medicine | Admitting: Emergency Medicine

## 2013-11-18 ENCOUNTER — Encounter (HOSPITAL_COMMUNITY): Payer: Self-pay | Admitting: Emergency Medicine

## 2013-11-18 DIAGNOSIS — Z880 Allergy status to penicillin: Secondary | ICD-10-CM | POA: Insufficient documentation

## 2013-11-18 DIAGNOSIS — Z8739 Personal history of other diseases of the musculoskeletal system and connective tissue: Secondary | ICD-10-CM | POA: Insufficient documentation

## 2013-11-18 DIAGNOSIS — N23 Unspecified renal colic: Secondary | ICD-10-CM | POA: Diagnosis not present

## 2013-11-18 DIAGNOSIS — Z7982 Long term (current) use of aspirin: Secondary | ICD-10-CM | POA: Insufficient documentation

## 2013-11-18 DIAGNOSIS — E7800 Pure hypercholesterolemia, unspecified: Secondary | ICD-10-CM | POA: Diagnosis not present

## 2013-11-18 DIAGNOSIS — R109 Unspecified abdominal pain: Secondary | ICD-10-CM | POA: Insufficient documentation

## 2013-11-18 DIAGNOSIS — Z8571 Personal history of Hodgkin lymphoma: Secondary | ICD-10-CM | POA: Diagnosis not present

## 2013-11-18 DIAGNOSIS — R110 Nausea: Secondary | ICD-10-CM | POA: Diagnosis not present

## 2013-11-18 DIAGNOSIS — Z8679 Personal history of other diseases of the circulatory system: Secondary | ICD-10-CM | POA: Insufficient documentation

## 2013-11-18 DIAGNOSIS — Z79899 Other long term (current) drug therapy: Secondary | ICD-10-CM | POA: Diagnosis not present

## 2013-11-18 LAB — COMPREHENSIVE METABOLIC PANEL
ALT: 18 U/L (ref 0–53)
AST: 25 U/L (ref 0–37)
Albumin: 4 g/dL (ref 3.5–5.2)
Alkaline Phosphatase: 133 U/L — ABNORMAL HIGH (ref 39–117)
Anion gap: 16 — ABNORMAL HIGH (ref 5–15)
BILIRUBIN TOTAL: 0.7 mg/dL (ref 0.3–1.2)
BUN: 20 mg/dL (ref 6–23)
CHLORIDE: 99 meq/L (ref 96–112)
CO2: 23 mEq/L (ref 19–32)
Calcium: 9.6 mg/dL (ref 8.4–10.5)
Creatinine, Ser: 1.11 mg/dL (ref 0.50–1.35)
GFR calc Af Amer: 76 mL/min — ABNORMAL LOW (ref >90)
GFR calc non Af Amer: 66 mL/min — ABNORMAL LOW (ref >90)
Glucose, Bld: 124 mg/dL — ABNORMAL HIGH (ref 70–99)
POTASSIUM: 4.9 meq/L (ref 3.7–5.3)
SODIUM: 138 meq/L (ref 137–147)
Total Protein: 7.7 g/dL (ref 6.0–8.3)

## 2013-11-18 LAB — URINALYSIS, ROUTINE W REFLEX MICROSCOPIC
Bilirubin Urine: NEGATIVE
GLUCOSE, UA: NEGATIVE mg/dL
KETONES UR: NEGATIVE mg/dL
Leukocytes, UA: NEGATIVE
Nitrite: NEGATIVE
Protein, ur: NEGATIVE mg/dL
Specific Gravity, Urine: 1.026 (ref 1.005–1.030)
Urobilinogen, UA: 0.2 mg/dL (ref 0.0–1.0)
pH: 5 (ref 5.0–8.0)

## 2013-11-18 LAB — CBC WITH DIFFERENTIAL/PLATELET
BASOS PCT: 0 % (ref 0–1)
Basophils Absolute: 0 10*3/uL (ref 0.0–0.1)
Eosinophils Absolute: 0 10*3/uL (ref 0.0–0.7)
Eosinophils Relative: 0 % (ref 0–5)
HCT: 48.1 % (ref 39.0–52.0)
Hemoglobin: 16.9 g/dL (ref 13.0–17.0)
Lymphocytes Relative: 22 % (ref 12–46)
Lymphs Abs: 2.6 10*3/uL (ref 0.7–4.0)
MCH: 33.2 pg (ref 26.0–34.0)
MCHC: 35.1 g/dL (ref 30.0–36.0)
MCV: 94.5 fL (ref 78.0–100.0)
MONOS PCT: 10 % (ref 3–12)
Monocytes Absolute: 1.1 10*3/uL — ABNORMAL HIGH (ref 0.1–1.0)
NEUTROS ABS: 7.9 10*3/uL — AB (ref 1.7–7.7)
NEUTROS PCT: 68 % (ref 43–77)
Platelets: 126 10*3/uL — ABNORMAL LOW (ref 150–400)
RBC: 5.09 MIL/uL (ref 4.22–5.81)
RDW: 13.3 % (ref 11.5–15.5)
WBC: 11.6 10*3/uL — ABNORMAL HIGH (ref 4.0–10.5)

## 2013-11-18 LAB — URINE MICROSCOPIC-ADD ON

## 2013-11-18 LAB — I-STAT CG4 LACTIC ACID, ED: Lactic Acid, Venous: 2.47 mmol/L — ABNORMAL HIGH (ref 0.5–2.2)

## 2013-11-18 MED ORDER — OXYCODONE-ACETAMINOPHEN 5-325 MG PO TABS: 0.5000 | ORAL_TABLET | Freq: Four times a day (QID) | ORAL | Status: DC | PRN

## 2013-11-18 NOTE — Discharge Instructions (Signed)
Please read and follow all provided instructions.  Your diagnoses today include:  1. Ureteral colic     Tests performed today include:  Urine test that showed blood in your urine and no infection  CT scan which showed findings consistent with a recently passed kidney stone  Blood test that showed normal kidney function  Vital signs. See below for your results today.   Medications prescribed:   Percocet (oxycodone/acetaminophen) - narcotic pain medication  DO NOT drive or perform any activities that require you to be awake and alert because this medicine can make you drowsy. BE VERY CAREFUL not to take multiple medicines containing Tylenol (also called acetaminophen). Doing so can lead to an overdose which can damage your liver and cause liver failure and possibly death.  Use pain medication only under direct supervision at the lowest possible dose needed to control your pain.   Take any prescribed medications only as directed.  Home care instructions:  Follow any educational materials contained in this packet.  Please double your fluid intake for the next several days. Strain your urine and save any stones that may pass.   BE VERY CAREFUL not to take multiple medicines containing Tylenol (also called acetaminophen). Doing so can lead to an overdose which can damage your liver and cause liver failure and possibly death.   Follow-up instructions: Please follow-up with your urologist or the urologist referral (provided on front page) in the next 1 week for further evaluation of your symptoms.  If you need to return to the Emergency Department, go to [facility] and not [facility]. The urologists are located at [facility] and can better care for you at this location.  Return instructions:  If you need to return to the Emergency Department, go to [facility] and not [facility]. The urologists are located at [facility] and can better care for you  at this location.   Please return to the Emergency Department if you experience worsening symptoms.  Please return if you develop fever or uncontrolled pain or vomiting.  Please return if you have any other emergent concerns.  Additional Information:  Your vital signs today were: BP 139/76   Pulse 77   Temp(Src) 97.6 F (36.4 C) (Oral)   Resp 20   SpO2 98% If your blood pressure (BP) was elevated above 135/85 this visit, please have this repeated by your doctor within one month. --------------

## 2013-11-18 NOTE — ED Provider Notes (Signed)
Medical screening examination/treatment/procedure(s) were conducted as a shared visit with non-physician practitioner(s) and myself.  I personally evaluated the patient during the encounter.   EKG Interpretation None     Patient here complaining of right abdominal pain. CT scan consistent with passed kidney stone. Patient pain-free at this time and will followup with urology  Leota Jacobsen, MD 11/18/13 [phone]

## 2013-11-18 NOTE — ED Notes (Signed)
Provided pt with coke

## 2013-11-18 NOTE — ED Notes (Signed)
Pt c/o constipation x 2 days, intermittent R side abdominal pain and intermittent nausea x 1 day.  Pain score 6/10.  Pt can not state anything that makes symptoms better or worse.  Pt sts "I start belching before the pain starts and then, my stomach growls as the pain is decreasing."

## 2013-11-18 NOTE — ED Provider Notes (Signed)
CSN: [id number]     Arrival date & time 11/18/13  6283 History   First MD Initiated Contact with Patient 11/18/13 1022     Chief Complaint  Patient presents with  . Abdominal Pain  . Nausea     (Consider location/radiation/quality/duration/timing/severity/associated sxs/prior Treatment) HPI Comments: Patient with history of non-Hodgkin's lymphoma, no previous abdominal surgeries, chronic constipation -- presents with complaint of intermittent right-sided lateral abdominal pain which began yesterday at approximately 2:30 PM. Patient has had intermittent pains since that point. Pain is severe and sharp lasting approximately 30 minutes at a time. Patient will have 30 minutes to several hours between episodes of pain. It is associate with nausea but no vomiting. No fever. Last bowel movement was 2 days ago. Patient has BM's every 1-3 days on average. No blood in stool. No urinary symptoms. The onset of this condition was acute. The course is constant. Aggravating factors: none. Alleviating factors: none.    Patient is a 68 y.o. male presenting with abdominal pain. The history is provided by the patient and medical records.  Abdominal Pain Associated symptoms: nausea   Associated symptoms: no chest pain, no cough, no diarrhea, no dysuria, no fever, no sore throat and no vomiting     Past Medical History  Diagnosis Date  . Lumbar spondylolysis   . Hypercholesteremia   . Cardiomegaly   . Cancer     NHL  . Shortness of breath    Past Surgical History  Procedure Laterality Date  . None    . Portacath placement  10/2009    Via IR   Family History  Problem Relation Age of Onset  . Heart attack    . Cancer     History  Substance Use Topics  . Smoking status: Never Smoker   . Smokeless tobacco: Not on file  . Alcohol Use: No    Review of Systems  Constitutional: Negative for fever.  HENT: Negative for rhinorrhea and sore throat.   Eyes: Negative for redness.  Respiratory:  Negative for cough.   Cardiovascular: Negative for chest pain.  Gastrointestinal: Positive for nausea and abdominal pain. Negative for vomiting and diarrhea.  Genitourinary: Positive for flank pain. Negative for dysuria, frequency, penile pain and testicular pain.  Musculoskeletal: Negative for myalgias.  Skin: Negative for rash.  Neurological: Negative for headaches.   Allergies  Penicillins  Home Medications   Prior to Admission medications   Medication Sig Start Date End Date Taking? Authorizing Provider  Ascorbic Acid (VITAMIN C) 1000 MG tablet Take 1,000 mg by mouth daily.    Historical Provider, MD  aspirin EC 81 MG tablet Take 81 mg by mouth daily.    Historical Provider, MD  atorvastatin (LIPITOR) 20 MG tablet Take 20 mg by mouth daily.  05/11/11   Historical Provider, MD  Cholecalciferol (VITAMIN D) 2000 UNITS tablet Take 2,000 Units by mouth daily.    Historical Provider, MD  loratadine (CLARITIN) 10 MG tablet Take 10 mg by mouth daily.     Historical Provider, MD  Multiple Vitamins-Minerals (MULTIVITAMIN PO) Take by mouth every morning.    Historical Provider, MD  naproxen sodium (ANAPROX) 220 MG tablet Take 220 mg by mouth 2 (two) times daily with a meal.    Historical Provider, MD  traMADol (ULTRAM) 50 MG tablet TAKE 1 TO 2 TABLETS BY MOUTH EVERY 6 HOURS AS NEEDED BACK PAIN 09/22/11   [name], MD   BP 171/93  Pulse 75  Temp(Src) 97.6 F (  36.4 C) (Oral)  Resp 20  SpO2 100%  Physical Exam  Nursing note and vitals reviewed. Constitutional: He appears well-developed and well-nourished.  HENT:  Head: Normocephalic and atraumatic.  Mouth/Throat: Oropharynx is clear and moist.  Eyes: Conjunctivae are normal. Right eye exhibits no discharge. Left eye exhibits no discharge.  Neck: Normal range of motion. Neck supple.  Cardiovascular: Normal rate, regular rhythm and normal heart sounds.   No murmur heard. Pulmonary/Chest: Effort normal and breath sounds normal. No  respiratory distress. He has no wheezes. He has no rales.  Abdominal: Soft. Bowel sounds are normal. He exhibits no abdominal bruit and no pulsatile midline mass. There is tenderness. There is no rebound and no guarding.  Currently mild 'soreness' R lateral abdomen. Pt states area very sore when pain severe.   Neurological: He is alert.  Skin: Skin is warm and dry.  Psychiatric: He has a normal mood and affect.    ED Course  Procedures (including critical care time) Labs Review Labs Reviewed  CBC WITH DIFFERENTIAL - Abnormal; Notable for the following:    WBC 11.6 (*)    Platelets 126 (*)    Neutro Abs 7.9 (*)    Monocytes Absolute 1.1 (*)    All other components within normal limits  COMPREHENSIVE METABOLIC PANEL - Abnormal; Notable for the following:    Glucose, Bld 124 (*)    Alkaline Phosphatase 133 (*)    GFR calc non Af Amer 66 (*)    GFR calc Af Amer 76 (*)    Anion gap 16 (*)    All other components within normal limits  URINALYSIS, ROUTINE W REFLEX MICROSCOPIC - Abnormal; Notable for the following:    Hgb urine dipstick MODERATE (*)    All other components within normal limits  URINE MICROSCOPIC-ADD ON - Abnormal; Notable for the following:    Crystals CA OXALATE CRYSTALS (*)    All other components within normal limits  I-STAT CG4 LACTIC ACID, ED - Abnormal; Notable for the following:    Lactic Acid, Venous 2.47 (*)    All other components within normal limits    Imaging Review Ct Abdomen Pelvis Wo Contrast  11/18/2013   CLINICAL DATA:  Right flank and right lower quadrant pain since yesterday. Dysuria.  EXAM: CT ABDOMEN AND PELVIS WITHOUT CONTRAST  TECHNIQUE: Multidetector CT imaging of the abdomen and pelvis was performed following the standard protocol without IV contrast.  COMPARISON:  PET 06/22/2012.  FINDINGS: Lung bases show scattered pulmonary nodules measuring up to 5 mm in the left lower lobe, as before. Heart size normal. No pericardial or pleural effusion.   Liver, gallbladder and adrenal glands are unremarkable. Right kidney appears mildly edematous with perinephric stranding. Mild right hydronephrosis. No ureteral or bladder stone, however. Left kidney, spleen, pancreas, stomach and bowel are unremarkable.  Prostate and bladder are are grossly unremarkable. No pathologically enlarged lymph nodes. No free fluid. Scattered atherosclerotic calcification of the arterial vasculature without abdominal aortic aneurysm. No worrisome lytic or sclerotic lesions. L1 compression fracture appears unchanged.  IMPRESSION: 1. Mild right hydronephrosis and mild right perinephric stranding without an associated urinary stone. Findings support recent passage of a right-sided stone. 2. Scattered pulmonary nodules, as on 06/22/2012, suggesting a benign etiology.   Electronically Signed   By: Lorin Picket M.D.   On: 11/18/2013 12:48   Dg Abd Acute W/chest  11/18/2013   CLINICAL DATA:  Abdominal pain.  History of lymphoma.  EXAM: ACUTE ABDOMEN SERIES (ABDOMEN 2  VIEW & CHEST 1 VIEW)  COMPARISON:  PET-CT, 06/22/2012  FINDINGS: No obstruction. No free air. Normal bowel gas pattern. Soft tissues are unremarkable.  Lungs are clear.  Heart is normal in size.  Compression fracture of L1, likely old. No osteoblastic or osteolytic lesions.  IMPRESSION: No acute findings.   Electronically Signed   By: Lajean Manes M.D.   On: 11/18/2013 11:43     EKG Interpretation None      10:45 AM Patient seen and examined. Work-up initiated. Medications ordered.   Vital signs reviewed and are as follows: Filed Vitals:   11/18/13 1004  BP: 171/93  Pulse: 75  Temp: 97.6 F (36.4 C)  Resp: 20   Patient discussed with Dr. Zenia Resides. CT non-contrast ordered.   2:20 PM Patient informed of results. Findings discussed with and patient seen by Dr. Zenia Resides. Will d/c to home with pain medication and urology follow-up.   Patient counseled on use of narcotic pain medications. Counseled not to combine  these medications with others containing tylenol. Urged not to drink alcohol, drive, or perform any other activities that requires focus while taking these medications. The patient verbalizes understanding and agrees with the plan.  MDM   Final diagnoses:  Ureteral colic   Patient with flank pain, crystals/blood in urine, CT with signs of recently passed stone. Pain is currently resolved fitting with this picture. No UTI or elevated creatinine. Will give pain meds for home. Urology f/u as discussed. Patient stable to d/c to home.   No dangerous or life-threatening conditions suspected or identified by history, physical exam, and by work-up. No indications for hospitalization identified.      Carlisle Cater, PA-C 11/18/13 1423

## 2013-11-21 NOTE — ED Provider Notes (Signed)
Medical screening examination/treatment/procedure(s) were performed by non-physician practitioner and as supervising physician I was immediately available for consultation/collaboration.   Leota Jacobsen, MD 11/21/13 [phone]

## 2014-01-03 ENCOUNTER — Encounter: Payer: Self-pay | Admitting: Hematology & Oncology

## 2014-01-03 ENCOUNTER — Other Ambulatory Visit (HOSPITAL_BASED_OUTPATIENT_CLINIC_OR_DEPARTMENT_OTHER): Payer: Medicare Other | Admitting: Lab

## 2014-01-03 ENCOUNTER — Ambulatory Visit (HOSPITAL_BASED_OUTPATIENT_CLINIC_OR_DEPARTMENT_OTHER): Payer: Medicare Other | Admitting: Hematology & Oncology

## 2014-01-03 VITALS — BP 117/84 | HR 70 | Temp 97.4°F | Resp 18 | Ht 70.0 in | Wt 203.0 lb

## 2014-01-03 DIAGNOSIS — C8590 Non-Hodgkin lymphoma, unspecified, unspecified site: Secondary | ICD-10-CM

## 2014-01-03 DIAGNOSIS — C8589 Other specified types of non-Hodgkin lymphoma, extranodal and solid organ sites: Secondary | ICD-10-CM

## 2014-01-03 LAB — COMPREHENSIVE METABOLIC PANEL
ALBUMIN: 4.3 g/dL (ref 3.5–5.2)
ALT: 14 U/L (ref 0–53)
AST: 15 U/L (ref 0–37)
Alkaline Phosphatase: 101 U/L (ref 39–117)
BUN: 15 mg/dL (ref 6–23)
CALCIUM: 9.5 mg/dL (ref 8.4–10.5)
CO2: 29 meq/L (ref 19–32)
Chloride: 101 mEq/L (ref 96–112)
Creatinine, Ser: 0.65 mg/dL (ref 0.50–1.35)
GLUCOSE: 90 mg/dL (ref 70–99)
POTASSIUM: 4.6 meq/L (ref 3.5–5.3)
SODIUM: 138 meq/L (ref 135–145)
TOTAL PROTEIN: 6.8 g/dL (ref 6.0–8.3)
Total Bilirubin: 0.7 mg/dL (ref 0.2–1.2)

## 2014-01-03 LAB — CBC WITH DIFFERENTIAL (CANCER CENTER ONLY)
BASO#: 0 10*3/uL (ref 0.0–0.2)
BASO%: 0.5 % (ref 0.0–2.0)
EOS ABS: 0.2 10*3/uL (ref 0.0–0.5)
EOS%: 1.9 % (ref 0.0–7.0)
HCT: 46.3 % (ref 38.7–49.9)
HEMOGLOBIN: 16.2 g/dL (ref 13.0–17.1)
LYMPH#: 2.9 10*3/uL (ref 0.9–3.3)
LYMPH%: 36.6 % (ref 14.0–48.0)
MCH: 33.7 pg — AB (ref 28.0–33.4)
MCHC: 35 g/dL (ref 32.0–35.9)
MCV: 96 fL (ref 82–98)
MONO#: 0.7 10*3/uL (ref 0.1–0.9)
MONO%: 8.8 % (ref 0.0–13.0)
NEUT%: 52.2 % (ref 40.0–80.0)
NEUTROS ABS: 4.2 10*3/uL (ref 1.5–6.5)
Platelets: 149 10*3/uL (ref 145–400)
RBC: 4.81 10*6/uL (ref 4.20–5.70)
RDW: 13.1 % (ref 11.1–15.7)
WBC: 8 10*3/uL (ref 4.0–10.0)

## 2014-01-03 LAB — LACTATE DEHYDROGENASE: LDH: 137 U/L (ref 94–250)

## 2014-01-03 NOTE — Progress Notes (Signed)
Hematology and Oncology Follow Up Visit  Walter [name] [id number] 09-Nov-1945 68 y.o. 01/03/2014   Principle Diagnosis:   Diffuse large cell non-Hodgkin's lymphoma-clinical remission  Current Therapy:    How this patient     Interim History:  Walter [name] is back for a six-month followup. He's doing pre-well. He apparently had episode of a kidney stone. This was over the summertime. I feel was a couple months ago. He is doing to the emergency room. He had a CT scan done. This did not show any obvious kidney stone. It was thought that there was a passed kidney stone on the right side.  Since this episode, has had no problems. He's had no fever. He's had no bleeding. There's been no hematuria. He's had no leg swelling. He's had no cough. He's had no weight loss weight gain. He's had no change in bowel or bladder habits.  Medications: Current outpatient prescriptions:Ascorbic Acid (VITAMIN C) 1000 MG tablet, Take 1,000 mg by mouth daily., Disp: , Rfl: ;  aspirin EC 81 MG tablet, Take 81 mg by mouth daily., Disp: , Rfl: ;  atorvastatin (LIPITOR) 20 MG tablet, Take 20 mg by mouth daily. , Disp: , Rfl: ;  Cholecalciferol (VITAMIN D) 2000 UNITS tablet, Take 2,000 Units by mouth daily., Disp: , Rfl: ;  docusate sodium (COLACE) 100 MG capsule, Take 100 mg by mouth daily., Disp: , Rfl:  loratadine (CLARITIN) 10 MG tablet, Take 10 mg by mouth daily. , Disp: , Rfl: ;  Multiple Vitamins-Minerals (MULTIVITAMIN PO), Take by mouth every morning., Disp: , Rfl: ;  naproxen sodium (ANAPROX) 220 MG tablet, Take 220 mg by mouth every morning. , Disp: , Rfl: ;  traMADol (ULTRAM) 50 MG tablet, 2 tabs daily    100 mg. in AM, Disp: , Rfl:   Allergies:  Allergies  Allergen Reactions  . Penicillins     itching    Past Medical History, Surgical history, Social history, and Family History were reviewed and updated.  Review of Systems: As above  Physical Exam:  height is 5\' 10"  (1.778 m) and weight is 203 lb  (92.08 kg). His oral temperature is 97.4 F (36.3 C). His blood pressure is 117/84 and his pulse is 70. His respiration is 18.   Well-developed and well-nourished white male. Head and neck exam shows no ocular or oral lesions. He is no palpable cervical or supraclavicular lymph nodes. Lungs are clear. Cardiac exam regular in rhythm. Abdomen soft. Has good bowel sounds. There is no guarding or rebound tenderness. There is no fluid wave. There is no palpable liver or spleen tip. Back exam no tenderness over the spine, ribs or hips. Extremities shows no clubbing, cyanosis or edema. Skin exam no rashes, ecchymoses or petechia. Neurological exam shows no focal neurological deficits.  Lab Results  Component Value Date   WBC 8.0 01/03/2014   HGB 16.2 01/03/2014   HCT 46.3 01/03/2014   MCV 96 01/03/2014   PLT 149 01/03/2014     Chemistry      Component Value Date/Time   NA 138 11/18/2013 1103   NA 141 07/19/2013 0806   K 4.9 11/18/2013 1103   K 4.4 07/19/2013 0806   CL 99 11/18/2013 1103   CL 102 07/19/2013 0806   CO2 23 11/18/2013 1103   CO2 32 07/19/2013 0806   BUN 20 11/18/2013 1103   BUN 17 07/19/2013 0806   CREATININE 1.11 11/18/2013 1103   CREATININE 0.6 07/19/2013 0806  Component Value Date/Time   CALCIUM 9.6 11/18/2013 1103   CALCIUM 9.1 07/19/2013 0806   ALKPHOS 133* 11/18/2013 1103   ALKPHOS 90* 07/19/2013 0806   AST 25 11/18/2013 1103   AST 20 07/19/2013 0806   ALT 18 11/18/2013 1103   ALT 23 07/19/2013 0806   BILITOT 0.7 11/18/2013 1103   BILITOT 0.80 07/19/2013 0806         Impression and Plan: Walter [name] is 68 year old gentleman with a history of a diffuse large cell non-Hodgkin's lymphoma. He's done very nicely. He now is out from his treatments by 3-1/2 years. He completed all his treatments back in March of 2012.  It sounds like he did have a kidney stone. I am not sure as to what else this could be. His CT scan was negative for any obvious lymphoma.  I think we can just get him back  in 6 months.  I don't see a need for any additional scans on him.  I spent about 40 minutes with he and his family. I went over his CT scan that he had done. I went over his lab work that he had done.  [name], MD 9/3/20156:25 PM

## 2014-01-04 ENCOUNTER — Encounter: Payer: Self-pay | Admitting: *Deleted

## 2014-01-17 ENCOUNTER — Other Ambulatory Visit: Payer: Medicare Other | Admitting: Lab

## 2014-01-17 ENCOUNTER — Ambulatory Visit: Payer: Medicare Other | Admitting: Hematology & Oncology

## 2014-03-20 ENCOUNTER — Encounter: Payer: Self-pay | Admitting: *Deleted

## 2014-07-04 ENCOUNTER — Ambulatory Visit (HOSPITAL_BASED_OUTPATIENT_CLINIC_OR_DEPARTMENT_OTHER): Payer: Medicare Other | Admitting: Hematology & Oncology

## 2014-07-04 ENCOUNTER — Other Ambulatory Visit (HOSPITAL_BASED_OUTPATIENT_CLINIC_OR_DEPARTMENT_OTHER): Payer: Medicare Other | Admitting: Lab

## 2014-07-04 ENCOUNTER — Encounter: Payer: Self-pay | Admitting: Hematology & Oncology

## 2014-07-04 VITALS — BP 132/76 | HR 71 | Resp 18 | Ht 70.0 in | Wt 208.0 lb

## 2014-07-04 DIAGNOSIS — Z8572 Personal history of non-Hodgkin lymphomas: Secondary | ICD-10-CM

## 2014-07-04 DIAGNOSIS — C8590 Non-Hodgkin lymphoma, unspecified, unspecified site: Secondary | ICD-10-CM

## 2014-07-04 DIAGNOSIS — M810 Age-related osteoporosis without current pathological fracture: Secondary | ICD-10-CM

## 2014-07-04 LAB — CBC WITH DIFFERENTIAL (CANCER CENTER ONLY)
BASO#: 0 10*3/uL (ref 0.0–0.2)
BASO%: 0.5 % (ref 0.0–2.0)
EOS%: 2.8 % (ref 0.0–7.0)
Eosinophils Absolute: 0.2 10*3/uL (ref 0.0–0.5)
HCT: 45.2 % (ref 38.7–49.9)
HEMOGLOBIN: 15.6 g/dL (ref 13.0–17.1)
LYMPH#: 2.9 10*3/uL (ref 0.9–3.3)
LYMPH%: 38.8 % (ref 14.0–48.0)
MCH: 33.4 pg (ref 28.0–33.4)
MCHC: 34.5 g/dL (ref 32.0–35.9)
MCV: 97 fL (ref 82–98)
MONO#: 0.6 10*3/uL (ref 0.1–0.9)
MONO%: 8.6 % (ref 0.0–13.0)
NEUT#: 3.7 10*3/uL (ref 1.5–6.5)
NEUT%: 49.3 % (ref 40.0–80.0)
PLATELETS: 146 10*3/uL (ref 145–400)
RBC: 4.67 10*6/uL (ref 4.20–5.70)
RDW: 12.6 % (ref 11.1–15.7)
WBC: 7.5 10*3/uL (ref 4.0–10.0)

## 2014-07-04 LAB — CMP (CANCER CENTER ONLY)
ALBUMIN: 3.9 g/dL (ref 3.3–5.5)
ALT: 25 U/L (ref 10–47)
AST: 27 U/L (ref 11–38)
Alkaline Phosphatase: 97 U/L — ABNORMAL HIGH (ref 26–84)
BUN: 14 mg/dL (ref 7–22)
CALCIUM: 9.4 mg/dL (ref 8.0–10.3)
CHLORIDE: 102 meq/L (ref 98–108)
CO2: 31 mEq/L (ref 18–33)
Creat: 0.8 mg/dl (ref 0.6–1.2)
Glucose, Bld: 100 mg/dL (ref 73–118)
POTASSIUM: 4.5 meq/L (ref 3.3–4.7)
SODIUM: 145 meq/L (ref 128–145)
Total Bilirubin: 0.7 mg/dl (ref 0.20–1.60)
Total Protein: 7.3 g/dL (ref 6.4–8.1)

## 2014-07-04 LAB — LACTATE DEHYDROGENASE: LDH: 132 U/L (ref 94–250)

## 2014-07-04 NOTE — Progress Notes (Signed)
Hematology and Oncology Follow Up Visit  ATILLA ZOLLNER [id number] 15-Aug-1945 69 y.o. 07/04/2014   Principle Diagnosis:   Diffuse large cell non-Hodgkin's lymphoma-clinical remission  Current Therapy:    Observation     Interim History:  Mr.  Blank is back for a six-month followup. He is doing okay. He still has some occasional back discomfort.  He has been watching his blood sugars. He isn't watching his diet. His wife does a good job in trying to manage his health issues.  He does take vitamin D. He does take aspirin.  He's had no cough. There's been no shortness of breath. He's had no issues with fever. He's had no rashes. He's had no leg swelling.  He's had no headache. He's had no visual issues. He's had no swallowing problems.   Medications:  Current outpatient prescriptions:  .  Ascorbic Acid (VITAMIN C) 1000 MG tablet, Take 1,000 mg by mouth daily., Disp: , Rfl:  .  aspirin EC 81 MG tablet, Take 81 mg by mouth daily., Disp: , Rfl:  .  atorvastatin (LIPITOR) 20 MG tablet, Take 20 mg by mouth daily. , Disp: , Rfl:  .  Cholecalciferol (VITAMIN D) 2000 UNITS tablet, Take 2,000 Units by mouth daily., Disp: , Rfl:  .  docusate sodium (COLACE) 100 MG capsule, Take 100 mg by mouth daily., Disp: , Rfl:  .  loratadine (CLARITIN) 10 MG tablet, Take 10 mg by mouth daily. , Disp: , Rfl:  .  Multiple Vitamins-Minerals (MULTIVITAMIN PO), Take by mouth every morning., Disp: , Rfl:  .  naproxen sodium (ANAPROX) 220 MG tablet, Take 220 mg by mouth every morning. , Disp: , Rfl:  .  traMADol (ULTRAM) 50 MG tablet, 2 tabs daily    100 mg. in AM, Disp: , Rfl:   Allergies:  Allergies  Allergen Reactions  . Penicillins     itching    Past Medical History, Surgical history, Social history, and Family History were reviewed and updated.  Review of Systems: As above  Physical Exam:  height is 5\' 10"  (1.778 m) and weight is 208 lb (94.348 kg). His blood pressure is 132/76 and his  pulse is 71. His respiration is 18.   Well-developed and well-nourished white male. Head and neck exam shows no ocular or oral lesions. He is no palpable cervical or supraclavicular lymph nodes. Lungs are clear. Cardiac exam regular rate and rhythm with no murmurs, rubs or bruits. Abdomen is soft. He has good bowel sounds. There is no guarding or rebound tenderness. There is no fluid wave. There is no palpable liver or spleen tip. Back exam shows no tenderness over the spine, ribs or hips. Extremities shows no clubbing, cyanosis or edema. He has good range of motion of his joints. He has good strength. Skin exam no rashes, ecchymoses or petechia. Neurological exam shows no focal neurological deficits.  Lab Results  Component Value Date   WBC 7.5 07/04/2014   HGB 15.6 07/04/2014   HCT 45.2 07/04/2014   MCV 97 07/04/2014   PLT 146 07/04/2014     Chemistry      Component Value Date/Time   NA 145 07/04/2014 0807   NA 138 01/03/2014 1509   K 4.5 07/04/2014 0807   K 4.6 01/03/2014 1509   CL 102 07/04/2014 0807   CL 101 01/03/2014 1509   CO2 31 07/04/2014 0807   CO2 29 01/03/2014 1509   BUN 14 07/04/2014 0807   BUN 15 01/03/2014 1509  CREATININE 0.8 07/04/2014 0807   CREATININE 0.65 01/03/2014 1509      Component Value Date/Time   CALCIUM 9.4 07/04/2014 0807   CALCIUM 9.5 01/03/2014 1509   ALKPHOS 97* 07/04/2014 0807   ALKPHOS 101 01/03/2014 1509   AST 27 07/04/2014 0807   AST 15 01/03/2014 1509   ALT 25 07/04/2014 0807   ALT 14 01/03/2014 1509   BILITOT 0.70 07/04/2014 0807   BILITOT 0.7 01/03/2014 1509         Impression and Plan: Mr. Rizzo is 69 year old gentleman with a history of a diffuse large cell non-Hodgkin's lymphoma. He's done very nicely. He now is out from his treatments by 4 years. He completed all his treatments back in March of 2012.  He is doing pretty well from my point of view.  I don't see any need for scans.  His blood sugars have been under  pretty good control.  I will plan to see him back in 6 months.  [name], MD 3/3/20161:51 PM

## 2015-01-02 ENCOUNTER — Other Ambulatory Visit (HOSPITAL_BASED_OUTPATIENT_CLINIC_OR_DEPARTMENT_OTHER): Payer: Medicare Other

## 2015-01-02 ENCOUNTER — Encounter: Payer: Self-pay | Admitting: Hematology & Oncology

## 2015-01-02 ENCOUNTER — Ambulatory Visit (HOSPITAL_BASED_OUTPATIENT_CLINIC_OR_DEPARTMENT_OTHER): Payer: Medicare Other | Admitting: Hematology & Oncology

## 2015-01-02 ENCOUNTER — Telehealth: Payer: Self-pay | Admitting: *Deleted

## 2015-01-02 VITALS — BP 145/93 | HR 67 | Temp 97.9°F | Resp 16 | Ht 70.0 in | Wt 210.0 lb

## 2015-01-02 DIAGNOSIS — C8590 Non-Hodgkin lymphoma, unspecified, unspecified site: Secondary | ICD-10-CM

## 2015-01-02 DIAGNOSIS — E559 Vitamin D deficiency, unspecified: Secondary | ICD-10-CM

## 2015-01-02 DIAGNOSIS — M810 Age-related osteoporosis without current pathological fracture: Secondary | ICD-10-CM

## 2015-01-02 DIAGNOSIS — Z8572 Personal history of non-Hodgkin lymphomas: Secondary | ICD-10-CM

## 2015-01-02 LAB — COMPREHENSIVE METABOLIC PANEL (CC13)
ALT: 18 U/L (ref 0–55)
AST: 17 U/L (ref 5–34)
Albumin: 3.8 g/dL (ref 3.5–5.0)
Alkaline Phosphatase: 109 U/L (ref 40–150)
Anion Gap: 8 mEq/L (ref 3–11)
BILIRUBIN TOTAL: 0.6 mg/dL (ref 0.20–1.20)
BUN: 13.1 mg/dL (ref 7.0–26.0)
CO2: 29 meq/L (ref 22–29)
Calcium: 9.5 mg/dL (ref 8.4–10.4)
Chloride: 105 mEq/L (ref 98–109)
Creatinine: 0.7 mg/dL (ref 0.7–1.3)
Glucose: 108 mg/dl (ref 70–140)
POTASSIUM: 4.6 meq/L (ref 3.5–5.1)
Sodium: 142 mEq/L (ref 136–145)
TOTAL PROTEIN: 6.7 g/dL (ref 6.4–8.3)

## 2015-01-02 LAB — CBC WITH DIFFERENTIAL (CANCER CENTER ONLY)
BASO#: 0 10*3/uL (ref 0.0–0.2)
BASO%: 0.6 % (ref 0.0–2.0)
EOS ABS: 0.2 10*3/uL (ref 0.0–0.5)
EOS%: 2.2 % (ref 0.0–7.0)
HCT: 45.6 % (ref 38.7–49.9)
HGB: 16.1 g/dL (ref 13.0–17.1)
LYMPH#: 2.6 10*3/uL (ref 0.9–3.3)
LYMPH%: 38.7 % (ref 14.0–48.0)
MCH: 33.5 pg — AB (ref 28.0–33.4)
MCHC: 35.3 g/dL (ref 32.0–35.9)
MCV: 95 fL (ref 82–98)
MONO#: 0.6 10*3/uL (ref 0.1–0.9)
MONO%: 8.7 % (ref 0.0–13.0)
NEUT#: 3.3 10*3/uL (ref 1.5–6.5)
NEUT%: 49.8 % (ref 40.0–80.0)
Platelets: 137 10*3/uL — ABNORMAL LOW (ref 145–400)
RBC: 4.8 10*6/uL (ref 4.20–5.70)
RDW: 12.6 % (ref 11.1–15.7)
WBC: 6.7 10*3/uL (ref 4.0–10.0)

## 2015-01-02 NOTE — Telephone Encounter (Addendum)
Patient aware of results.   ----- Message from [name], MD sent at 01/02/2015 11:28 AM EDT ----- Call - labs are perfect!!!!  Laurey Arrow

## 2015-01-02 NOTE — Progress Notes (Signed)
Hematology and Oncology Follow Up Visit  PHENIX VANDERMEULEN [id number] 10-26-1945 69 y.o. 01/02/2015   Principle Diagnosis:   Diffuse large cell non-Hodgkin's lymphoma-clinical remission  Current Therapy:    Observation     Interim History:  Mr.  Almquist is back for a six-month followup. He is doing okay. He still has some occasional back discomfort. I think that the back discomfort will always be there. He  initially presented with disease in his spine. The biopsy that he had  Was at the L3 vertebral body. Because of this, he will always have some discomfort. I don't think he is at the point that he needs a vertebroplasty.   Unfortunately, his wife is now dealing with breast cancer. She is going to have a mastectomy the end of September. We will be following her. I taught her a little bit about her issue. I will want send off an Oncotype assay on her as her tumor is ER positive and HER-2 negative. I need to see if she has any positive lymph nodes. I would think that she would not.  He has been watching his blood sugars. He isn't watching his diet. His wife does a good job in trying to manage his health issues.  He does take vitamin D. He does take aspirin.  He's had no cough. There's been no shortness of breath. He's had no issues with fever. He's had no rashes. He's had no leg swelling.  He's had no headache. He's had no visual issues. He's had no swallowing problems.   Medications:  Current outpatient prescriptions:  .  Ascorbic Acid (VITAMIN C) 1000 MG tablet, Take 1,000 mg by mouth daily., Disp: , Rfl:  .  aspirin EC 81 MG tablet, Take 81 mg by mouth daily., Disp: , Rfl:  .  atorvastatin (LIPITOR) 20 MG tablet, Take 20 mg by mouth daily. , Disp: , Rfl:  .  Cholecalciferol (VITAMIN D) 2000 UNITS tablet, Take 2,000 Units by mouth daily., Disp: , Rfl:  .  docusate sodium (COLACE) 100 MG capsule, Take 100 mg by mouth daily., Disp: , Rfl:  .  loratadine (CLARITIN) 10 MG tablet, Take  10 mg by mouth daily. , Disp: , Rfl:  .  Multiple Vitamins-Minerals (MULTIVITAMIN PO), Take by mouth every morning., Disp: , Rfl:  .  naproxen sodium (ANAPROX) 220 MG tablet, Take 220 mg by mouth every morning. , Disp: , Rfl:  .  traMADol (ULTRAM) 50 MG tablet, 2 tabs daily    100 mg. in AM, Disp: , Rfl:   Allergies:  Allergies  Allergen Reactions  . Penicillins     itching    Past Medical History, Surgical history, Social history, and Family History were reviewed and updated.  Review of Systems: As above  Physical Exam:  height is '5\' 10"'  (1.778 m) and weight is 210 lb (95.255 kg). His oral temperature is 97.9 F (36.6 C). His blood pressure is 145/93 and his pulse is 67. His respiration is 16.   Well-developed and well-nourished white male. Head and neck exam shows no ocular or oral lesions. He is no palpable cervical or supraclavicular lymph nodes. Lungs are clear. Cardiac exam regular rate and rhythm with no murmurs, rubs or bruits. Abdomen is soft. He has good bowel sounds. There is no guarding or rebound tenderness. There is no fluid wave. There is no palpable liver or spleen tip. Back exam shows no tenderness over the spine, ribs or hips. Extremities shows no clubbing, cyanosis or  edema. He has good range of motion of his joints. He has good strength. Skin exam no rashes, ecchymoses or petechia. Neurological exam shows no focal neurological deficits.  Lab Results  Component Value Date   WBC 6.7 01/02/2015   HGB 16.1 01/02/2015   HCT 45.6 01/02/2015   MCV 95 01/02/2015   PLT 137* 01/02/2015     Chemistry      Component Value Date/Time   NA 142 01/02/2015 0816   NA 145 07/04/2014 0807   NA 138 01/03/2014 1509   K 4.6 01/02/2015 0816   K 4.5 07/04/2014 0807   K 4.6 01/03/2014 1509   CL 102 07/04/2014 0807   CL 101 01/03/2014 1509   CO2 29 01/02/2015 0816   CO2 31 07/04/2014 0807   CO2 29 01/03/2014 1509   BUN 13.1 01/02/2015 0816   BUN 14 07/04/2014 0807   BUN 15  01/03/2014 1509   CREATININE 0.7 01/02/2015 0816   CREATININE 0.8 07/04/2014 0807   CREATININE 0.65 01/03/2014 1509      Component Value Date/Time   CALCIUM 9.5 01/02/2015 0816   CALCIUM 9.4 07/04/2014 0807   CALCIUM 9.5 01/03/2014 1509   ALKPHOS 109 01/02/2015 0816   ALKPHOS 97* 07/04/2014 0807   ALKPHOS 101 01/03/2014 1509   AST 17 01/02/2015 0816   AST 27 07/04/2014 0807   AST 15 01/03/2014 1509   ALT 18 01/02/2015 0816   ALT 25 07/04/2014 0807   ALT 14 01/03/2014 1509   BILITOT 0.60 01/02/2015 0816   BILITOT 0.70 07/04/2014 0807   BILITOT 0.7 01/03/2014 1509         Impression and Plan: Mr. Ruderman is 69 year old gentleman with a history of a diffuse large cell non-Hodgkin's lymphoma. He's done very nicely. He now is out from his treatments by 41/2 years. He completed all his treatments back in March of 2012.  He is doing pretty well from my point of view.  I don't see any need for scans.  His blood sugars have been under pretty good control.  I will plan to see him back in 6 months.  [name], MD 9/1/201611:52 AM

## 2015-01-03 ENCOUNTER — Telehealth: Payer: Self-pay | Admitting: *Deleted

## 2015-01-03 LAB — VITAMIN D 25 HYDROXY (VIT D DEFICIENCY, FRACTURES): VIT D 25 HYDROXY: 47 ng/mL (ref 30–100)

## 2015-01-03 LAB — LACTATE DEHYDROGENASE: LDH: 125 U/L (ref 94–250)

## 2015-01-03 NOTE — Telephone Encounter (Signed)
-----   Message from [name], MD sent at 01/03/2015 12:28 PM EDT ----- Call - vit D level is great!!!  pete

## 2015-01-07 ENCOUNTER — Encounter: Payer: Self-pay | Admitting: Nurse Practitioner

## 2015-07-03 ENCOUNTER — Encounter: Payer: Self-pay | Admitting: Hematology & Oncology

## 2015-07-03 ENCOUNTER — Other Ambulatory Visit (HOSPITAL_BASED_OUTPATIENT_CLINIC_OR_DEPARTMENT_OTHER): Payer: Medicare Other

## 2015-07-03 ENCOUNTER — Ambulatory Visit (HOSPITAL_BASED_OUTPATIENT_CLINIC_OR_DEPARTMENT_OTHER): Payer: Medicare Other | Admitting: Hematology & Oncology

## 2015-07-03 VITALS — BP 135/73 | HR 69 | Temp 97.3°F | Resp 16 | Ht 70.0 in | Wt 210.0 lb

## 2015-07-03 DIAGNOSIS — E559 Vitamin D deficiency, unspecified: Secondary | ICD-10-CM

## 2015-07-03 DIAGNOSIS — M818 Other osteoporosis without current pathological fracture: Secondary | ICD-10-CM

## 2015-07-03 DIAGNOSIS — Z8572 Personal history of non-Hodgkin lymphomas: Secondary | ICD-10-CM | POA: Diagnosis present

## 2015-07-03 DIAGNOSIS — M545 Low back pain: Secondary | ICD-10-CM

## 2015-07-03 DIAGNOSIS — C8333 Diffuse large B-cell lymphoma, intra-abdominal lymph nodes: Secondary | ICD-10-CM

## 2015-07-03 DIAGNOSIS — E119 Type 2 diabetes mellitus without complications: Secondary | ICD-10-CM | POA: Diagnosis not present

## 2015-07-03 DIAGNOSIS — T387X5A Adverse effect of androgens and anabolic congeners, initial encounter: Secondary | ICD-10-CM

## 2015-07-03 DIAGNOSIS — C8590 Non-Hodgkin lymphoma, unspecified, unspecified site: Secondary | ICD-10-CM

## 2015-07-03 LAB — CBC WITH DIFFERENTIAL (CANCER CENTER ONLY)
BASO#: 0.1 10*3/uL (ref 0.0–0.2)
BASO%: 1.1 % (ref 0.0–2.0)
EOS%: 4.7 % (ref 0.0–7.0)
Eosinophils Absolute: 0.3 10*3/uL (ref 0.0–0.5)
HCT: 46.9 % (ref 38.7–49.9)
HGB: 16.9 g/dL (ref 13.0–17.1)
LYMPH#: 2.8 10*3/uL (ref 0.9–3.3)
LYMPH%: 45.1 % (ref 14.0–48.0)
MCH: 33.8 pg — ABNORMAL HIGH (ref 28.0–33.4)
MCHC: 36 g/dL — ABNORMAL HIGH (ref 32.0–35.9)
MCV: 94 fL (ref 82–98)
MONO#: 0.6 10*3/uL (ref 0.1–0.9)
MONO%: 9.5 % (ref 0.0–13.0)
NEUT%: 39.6 % — ABNORMAL LOW (ref 40.0–80.0)
NEUTROS ABS: 2.5 10*3/uL (ref 1.5–6.5)
Platelets: 144 10*3/uL — ABNORMAL LOW (ref 145–400)
RBC: 5 10*6/uL (ref 4.20–5.70)
RDW: 13 % (ref 11.1–15.7)
WBC: 6.2 10*3/uL (ref 4.0–10.0)

## 2015-07-03 LAB — COMPREHENSIVE METABOLIC PANEL
ALBUMIN: 3.9 g/dL (ref 3.5–5.0)
ALK PHOS: 102 U/L (ref 40–150)
ALT: 17 U/L (ref 0–55)
AST: 16 U/L (ref 5–34)
Anion Gap: 9 mEq/L (ref 3–11)
BILIRUBIN TOTAL: 0.6 mg/dL (ref 0.20–1.20)
BUN: 14 mg/dL (ref 7.0–26.0)
CALCIUM: 9 mg/dL (ref 8.4–10.4)
CO2: 26 mEq/L (ref 22–29)
CREATININE: 0.7 mg/dL (ref 0.7–1.3)
Chloride: 106 mEq/L (ref 98–109)
EGFR: >90 mL/min/{1.73_m2} (ref >90)
GLUCOSE: 94 mg/dL (ref 70–140)
POTASSIUM: 4.3 meq/L (ref 3.5–5.1)
SODIUM: 140 meq/L (ref 136–145)
TOTAL PROTEIN: 7 g/dL (ref 6.4–8.3)

## 2015-07-03 NOTE — Progress Notes (Signed)
Hematology and Oncology Follow Up Visit  SAVONE WEHRI [id number] 01-18-1946 70 y.o. 07/03/2015   Principle Diagnosis:   Diffuse large cell non-Hodgkin's lymphoma-clinical remission  Current Therapy:    Observation     Interim History:  Mr.  Huffman is back for a six-month followup. He is doing okay. He still has some occasional back discomfort. I think that the back discomfort will always be there. He  initially presented with disease in his spine. The biopsy that he had was at the L3 vertebral body. Because of this, he will always have some discomfort. I don't think he is at the point that he needs a vertebroplasty.   He is trying to limit his outdoor activities right now. He is worried that his wife might catch something from him. She is dealing with, occasions from having her lumpectomies. She just has had a problems with infections. Thank you, her drainage catheters are now out. They ran for 4 months   He has been watching his blood sugars. He is watching his diet. His wife does a good job in trying to manage his health issues.  He does take vitamin D. He does take aspirin.  He's had no cough. There's been no shortness of breath. He's had no issues with fever. He's had no rashes. He's had no leg swelling.  He's had no headache. He's had no visual issues. He's had no swallowing problems.   Medications:  Current outpatient prescriptions:  .  Ascorbic Acid (VITAMIN C) 1000 MG tablet, Take 1,000 mg by mouth daily., Disp: , Rfl:  .  aspirin EC 81 MG tablet, Take 81 mg by mouth daily., Disp: , Rfl:  .  atorvastatin (LIPITOR) 20 MG tablet, Take 20 mg by mouth daily. , Disp: , Rfl:  .  Cholecalciferol (VITAMIN D) 2000 UNITS tablet, Take 2,000 Units by mouth daily., Disp: , Rfl:  .  docusate sodium (COLACE) 100 MG capsule, Take 100 mg by mouth daily., Disp: , Rfl:  .  loratadine (CLARITIN) 10 MG tablet, Take 10 mg by mouth daily. , Disp: , Rfl:  .  Multiple Vitamins-Minerals  (MULTIVITAMIN PO), Take by mouth every morning., Disp: , Rfl:  .  naproxen sodium (ANAPROX) 220 MG tablet, Take 220 mg by mouth every morning. , Disp: , Rfl:  .  traMADol (ULTRAM) 50 MG tablet, 2 tabs daily    100 mg. in AM, Disp: , Rfl:   Allergies:  Allergies  Allergen Reactions  . Penicillins     itching    Past Medical History, Surgical history, Social history, and Family History were reviewed and updated.  Review of Systems: As above  Physical Exam:  height is 5\' 10"  (1.778 m) and weight is 210 lb (95.255 kg). His oral temperature is 97.3 F (36.3 C). His blood pressure is 135/73 and his pulse is 69. His respiration is 16.   Well-developed and well-nourished white male. Head and neck exam shows no ocular or oral lesions. He is no palpable cervical or supraclavicular lymph nodes. Lungs are clear. Cardiac exam regular rate and rhythm with no murmurs, rubs or bruits. Abdomen is soft. He has good bowel sounds. There is no guarding or rebound tenderness. There is no fluid wave. There is no palpable liver or spleen tip. Back exam shows no tenderness over the spine, ribs or hips. Extremities shows no clubbing, cyanosis or edema. He has good range of motion of his joints. He has good strength. Skin exam no rashes, ecchymoses or  petechia. Neurological exam shows no focal neurological deficits.  Lab Results  Component Value Date   WBC 6.2 07/03/2015   HGB 16.9 07/03/2015   HCT 46.9 07/03/2015   MCV 94 07/03/2015   PLT 144* 07/03/2015     Chemistry      Component Value Date/Time   NA 142 01/02/2015 0816   NA 145 07/04/2014 0807   NA 138 01/03/2014 1509   K 4.6 01/02/2015 0816   K 4.5 07/04/2014 0807   K 4.6 01/03/2014 1509   CL 102 07/04/2014 0807   CL 101 01/03/2014 1509   CO2 29 01/02/2015 0816   CO2 31 07/04/2014 0807   CO2 29 01/03/2014 1509   BUN 13.1 01/02/2015 0816   BUN 14 07/04/2014 0807   BUN 15 01/03/2014 1509   CREATININE 0.7 01/02/2015 0816   CREATININE 0.8  07/04/2014 0807   CREATININE 0.65 01/03/2014 1509      Component Value Date/Time   CALCIUM 9.5 01/02/2015 0816   CALCIUM 9.4 07/04/2014 0807   CALCIUM 9.5 01/03/2014 1509   ALKPHOS 109 01/02/2015 0816   ALKPHOS 97* 07/04/2014 0807   ALKPHOS 101 01/03/2014 1509   AST 17 01/02/2015 0816   AST 27 07/04/2014 0807   AST 15 01/03/2014 1509   ALT 18 01/02/2015 0816   ALT 25 07/04/2014 0807   ALT 14 01/03/2014 1509   BILITOT 0.60 01/02/2015 0816   BILITOT 0.70 07/04/2014 0807   BILITOT 0.7 01/03/2014 1509         Impression and Plan: Mr. Blakely is 70 year old gentleman with a history of a diffuse large cell non-Hodgkin's lymphoma. He's done very nicely. He now is out from his treatments by 5 years. He completed all his treatments back in March of 2012.  He is doing pretty well from my point of view.  I don't see any need for scans.  His blood sugars have been under pretty good control.  I will plan to see him back in 6 months.  [name], MD 3/2/20178:26 AM

## 2015-07-04 ENCOUNTER — Encounter: Payer: Self-pay | Admitting: *Deleted

## 2015-07-04 LAB — VITAMIN D 25 HYDROXY (VIT D DEFICIENCY, FRACTURES): Vitamin D, 25-Hydroxy: 46.5 ng/mL (ref 30.0–100.0)

## 2015-07-19 IMAGING — CT CT ABD-PELV W/O CM
1 series · 14 of 20 positions shown, 19 images · non-contrast
Comparison: PET 06/22/2012.

CLINICAL DATA: Right flank and right lower quadrant pain since
yesterday. Dysuria.

EXAM:
CT ABDOMEN AND PELVIS WITHOUT CONTRAST
TECHNIQUE: Multidetector CT imaging of the abdomen and pelvis was performed
following the standard protocol without IV contrast.

[Series 4: lung · axial · 0.82mm/px · z∈[-102,-16]mm · 14 of 20 slices shown, 19 images]
[im 2/20  soft-tissue]
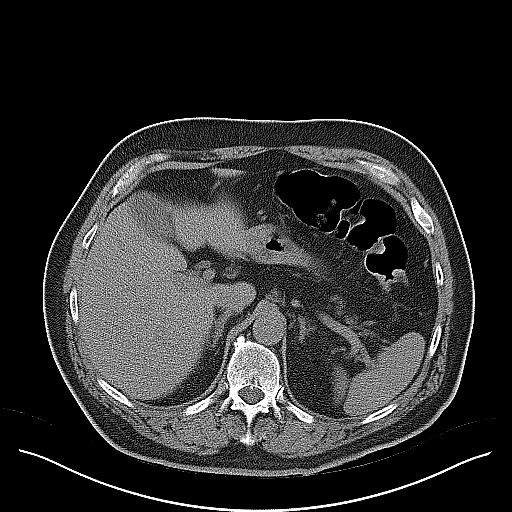
[im 2/20  bone]
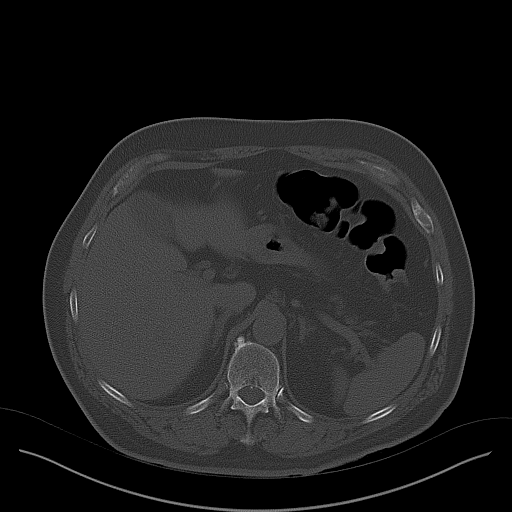
[im 4/20  soft-tissue]
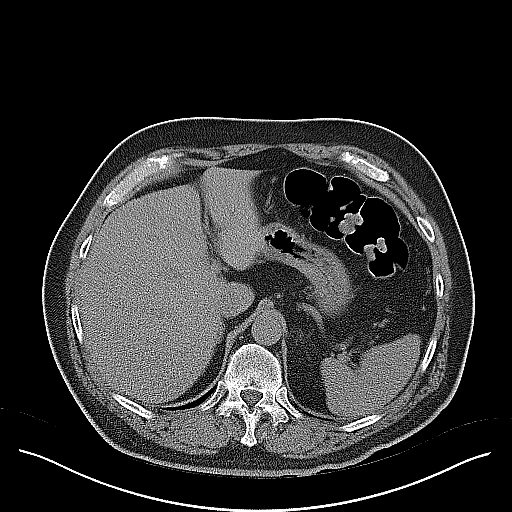
[im 5/20  soft-tissue]
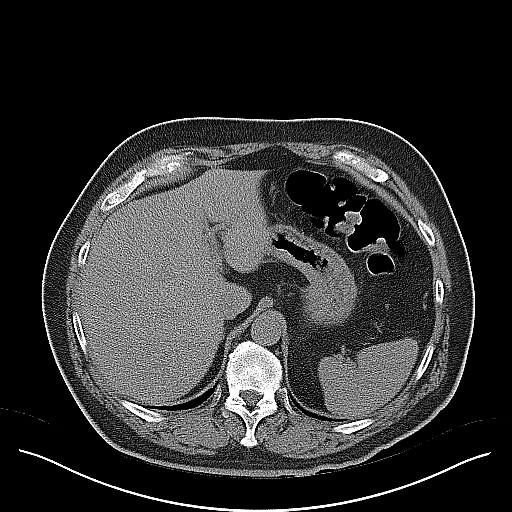
[im 6/20  soft-tissue]
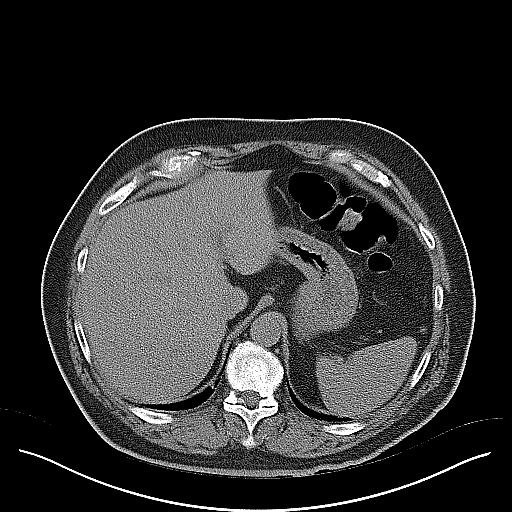
[im 8/20  soft-tissue]
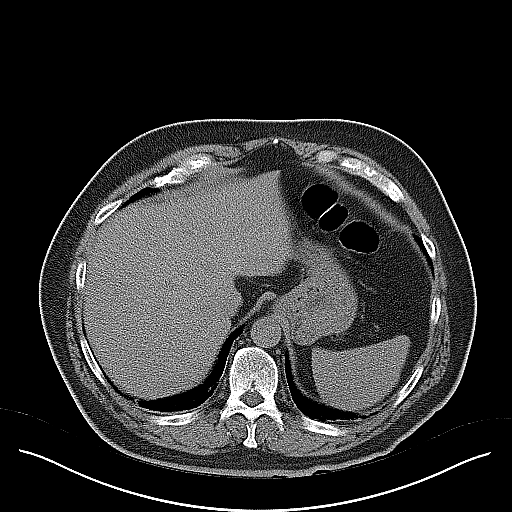
[im 9/20  soft-tissue]
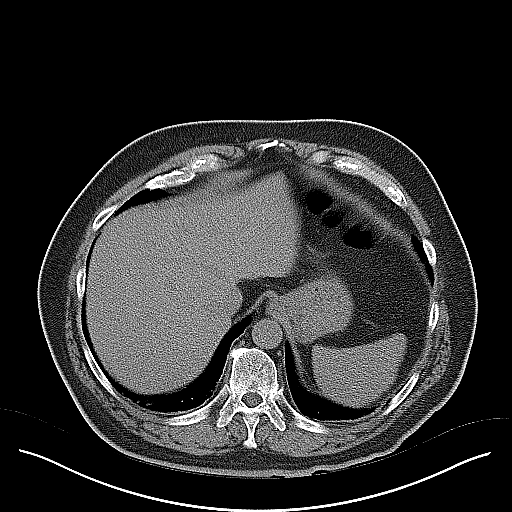
[im 11/20  soft-tissue]
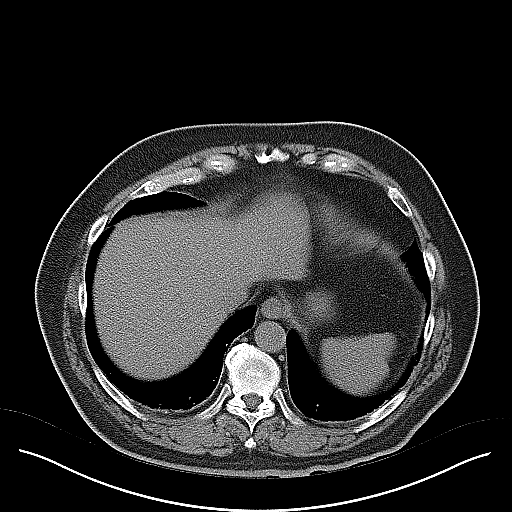
[im 12/20  soft-tissue]
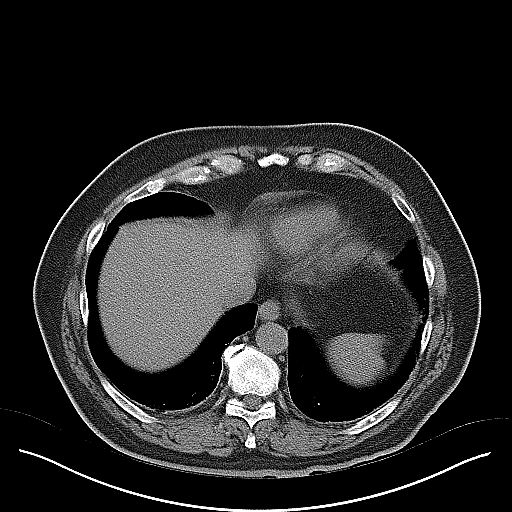
[im 13/20  soft-tissue]
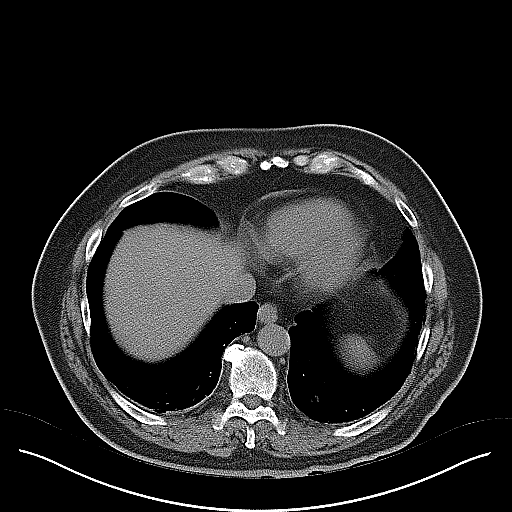
[im 13/20  bone]
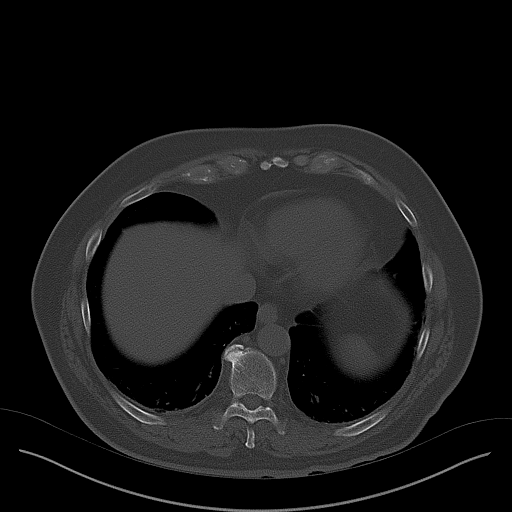
[im 15/20  soft-tissue]
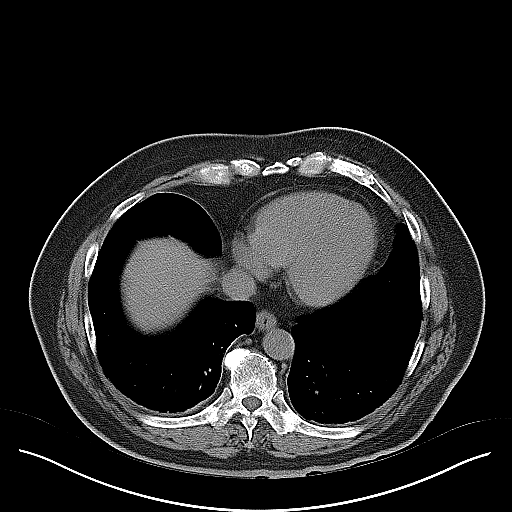
[im 16/20  soft-tissue]
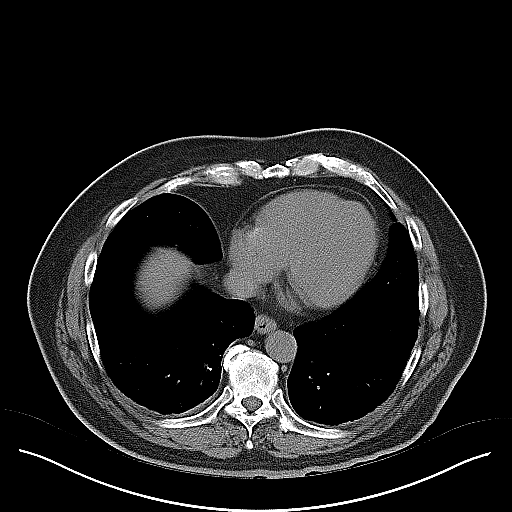
[im 16/20  lung]
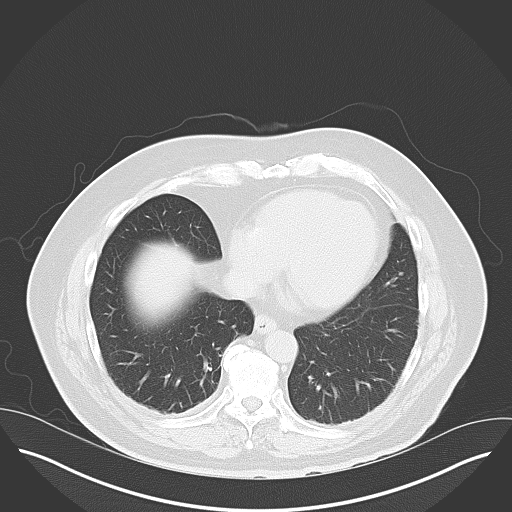
[im 17/20  soft-tissue]
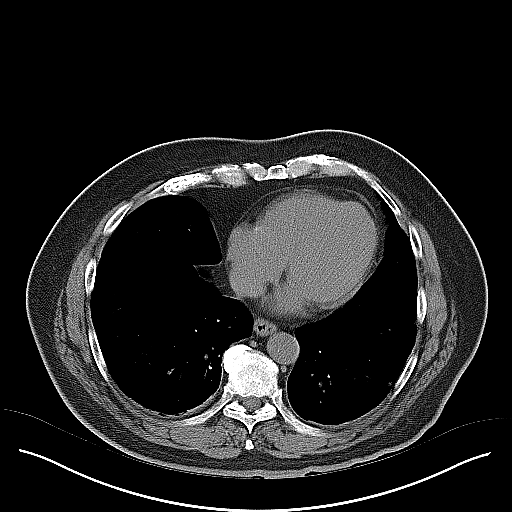
[im 17/20  lung]
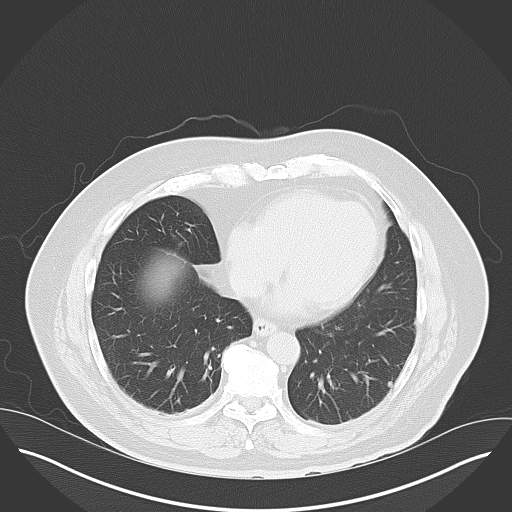
[im 18/20  lung]
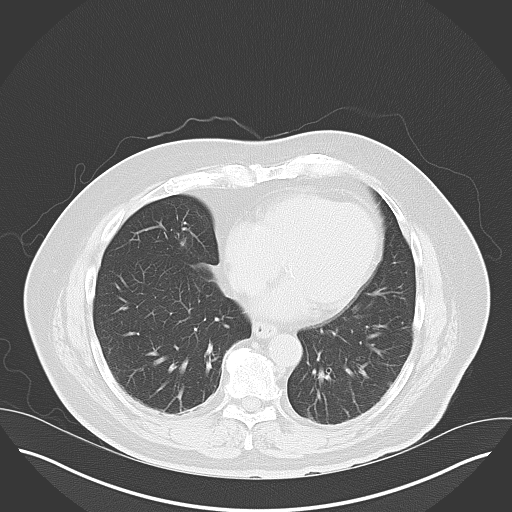
[im 19/20  soft-tissue]
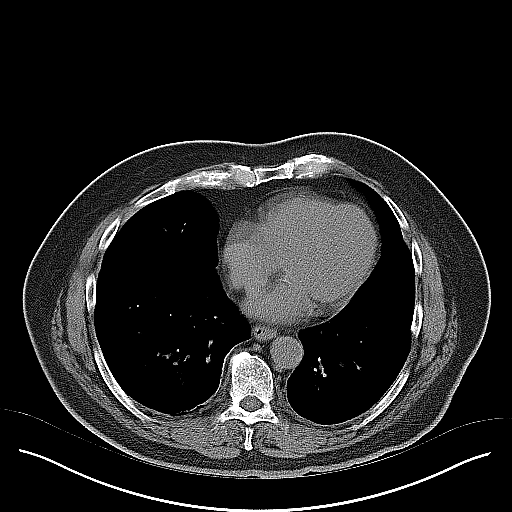
[im 19/20  lung]
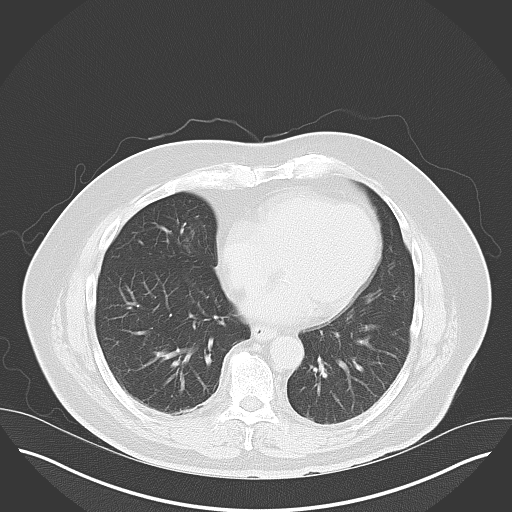

[14 of 20 positions shown; findings below may reference images not displayed]

FINDINGS: Lung bases show scattered pulmonary nodules measuring up to 5 mm in
the left lower lobe, as before. Heart size normal. No pericardial or
pleural effusion.

Liver, gallbladder and adrenal glands are unremarkable. Right kidney
appears mildly edematous with perinephric stranding. Mild right
hydronephrosis. No ureteral or bladder stone, however. Left kidney,
spleen, pancreas, stomach and bowel are unremarkable.

Prostate and bladder are are grossly unremarkable. No pathologically
enlarged lymph nodes. No free fluid. Scattered atherosclerotic
calcification of the arterial vasculature without abdominal aortic
aneurysm. No worrisome lytic or sclerotic lesions. L1 compression
fracture appears unchanged.
IMPRESSION: 1. Mild right hydronephrosis and mild right perinephric stranding
without an associated urinary stone. Findings support recent passage
of a right-sided stone.
2. Scattered pulmonary nodules, as on 06/22/2012, suggesting a
benign etiology.

## 2015-08-28 ENCOUNTER — Other Ambulatory Visit: Payer: Self-pay | Admitting: Hematology & Oncology

## 2015-08-28 DIAGNOSIS — M5441 Lumbago with sciatica, right side: Secondary | ICD-10-CM

## 2015-08-29 ENCOUNTER — Other Ambulatory Visit: Payer: Self-pay | Admitting: Family

## 2015-08-29 DIAGNOSIS — C8333 Diffuse large B-cell lymphoma, intra-abdominal lymph nodes: Secondary | ICD-10-CM

## 2015-09-08 ENCOUNTER — Ambulatory Visit
Admission: RE | Admit: 2015-09-08 | Discharge: 2015-09-08 | Disposition: A | Discharge status: Home / Self Care | Payer: Medicare Other | Source: Ambulatory Visit | Attending: Family | Admitting: Family

## 2015-09-08 DIAGNOSIS — C8333 Diffuse large B-cell lymphoma, intra-abdominal lymph nodes: Secondary | ICD-10-CM

## 2015-09-08 MED ORDER — GADOBENATE DIMEGLUMINE 529 MG/ML IV SOLN
19.0000 mL | Freq: Once | INTRAVENOUS | Status: AC | PRN
  Administered 2015-09-08: 19 mL via INTRAVENOUS

## 2016-01-08 ENCOUNTER — Other Ambulatory Visit: Payer: Medicare Other

## 2016-01-08 ENCOUNTER — Ambulatory Visit: Payer: Medicare Other | Admitting: Hematology & Oncology

## 2016-01-14 ENCOUNTER — Other Ambulatory Visit (HOSPITAL_BASED_OUTPATIENT_CLINIC_OR_DEPARTMENT_OTHER): Payer: Medicare Other

## 2016-01-14 ENCOUNTER — Encounter: Payer: Self-pay | Admitting: Hematology & Oncology

## 2016-01-14 ENCOUNTER — Ambulatory Visit (HOSPITAL_BASED_OUTPATIENT_CLINIC_OR_DEPARTMENT_OTHER): Payer: Medicare Other | Admitting: Hematology & Oncology

## 2016-01-14 VITALS — BP 137/93 | HR 75 | Temp 97.4°F | Resp 18 | Ht 70.0 in | Wt 212.0 lb

## 2016-01-14 DIAGNOSIS — C8333 Diffuse large B-cell lymphoma, intra-abdominal lymph nodes: Secondary | ICD-10-CM

## 2016-01-14 DIAGNOSIS — Z8572 Personal history of non-Hodgkin lymphomas: Secondary | ICD-10-CM | POA: Diagnosis present

## 2016-01-14 DIAGNOSIS — T387X5A Adverse effect of androgens and anabolic congeners, initial encounter: Secondary | ICD-10-CM

## 2016-01-14 DIAGNOSIS — M545 Low back pain: Secondary | ICD-10-CM

## 2016-01-14 DIAGNOSIS — M818 Other osteoporosis without current pathological fracture: Secondary | ICD-10-CM

## 2016-01-14 DIAGNOSIS — E119 Type 2 diabetes mellitus without complications: Secondary | ICD-10-CM

## 2016-01-14 DIAGNOSIS — E0800 Diabetes mellitus due to underlying condition with hyperosmolarity without nonketotic hyperglycemic-hyperosmolar coma (NKHHC): Secondary | ICD-10-CM

## 2016-01-14 LAB — CBC WITH DIFFERENTIAL (CANCER CENTER ONLY)
BASO#: 0 10*3/uL (ref 0.0–0.2)
BASO%: 0.6 % (ref 0.0–2.0)
EOS%: 2.1 % (ref 0.0–7.0)
Eosinophils Absolute: 0.1 10*3/uL (ref 0.0–0.5)
HEMATOCRIT: 46.1 % (ref 38.7–49.9)
HGB: 16.5 g/dL (ref 13.0–17.1)
LYMPH#: 3.3 10*3/uL (ref 0.9–3.3)
LYMPH%: 48.5 % — ABNORMAL HIGH (ref 14.0–48.0)
MCH: 33.7 pg — ABNORMAL HIGH (ref 28.0–33.4)
MCHC: 35.8 g/dL (ref 32.0–35.9)
MCV: 94 fL (ref 82–98)
MONO#: 0.7 10*3/uL (ref 0.1–0.9)
MONO%: 9.6 % (ref 0.0–13.0)
NEUT#: 2.7 10*3/uL (ref 1.5–6.5)
NEUT%: 39.2 % — ABNORMAL LOW (ref 40.0–80.0)
Platelets: 155 10*3/uL (ref 145–400)
RBC: 4.89 10*6/uL (ref 4.20–5.70)
RDW: 12.7 % (ref 11.1–15.7)
WBC: 6.8 10*3/uL (ref 4.0–10.0)

## 2016-01-14 LAB — COMPREHENSIVE METABOLIC PANEL (CC13)
A/G RATIO: 1.4 (ref 1.2–2.2)
ALBUMIN: 4.1 g/dL (ref 3.5–4.8)
ALK PHOS: 105 IU/L (ref 39–117)
ALT: 21 IU/L (ref 0–44)
AST: 20 IU/L (ref 0–40)
BUN / CREAT RATIO: 18 (ref 10–24)
BUN: 11 mg/dL (ref 8–27)
Bilirubin Total: 0.4 mg/dL (ref 0.0–1.2)
Calcium, Ser: 9.4 mg/dL (ref 8.6–10.2)
Carbon Dioxide, Total: 25 mmol/L (ref 18–29)
Chloride, Ser: 102 mmol/L (ref 96–106)
Creatinine, Ser: 0.6 mg/dL — ABNORMAL LOW (ref 0.76–1.27)
GFR calc Af Amer: 117 mL/min/{1.73_m2} (ref >59)
GFR, EST NON AFRICAN AMERICAN: 101 mL/min/{1.73_m2} (ref >59)
GLOBULIN, TOTAL: 3 g/dL (ref 1.5–4.5)
Glucose: 71 mg/dL (ref 65–99)
POTASSIUM: 4.3 mmol/L (ref 3.5–5.2)
SODIUM: 137 mmol/L (ref 134–144)
Total Protein: 7.1 g/dL (ref 6.0–8.5)

## 2016-01-14 NOTE — Progress Notes (Signed)
Hematology and Oncology Follow Up Visit  Walter [name] [id number] [birth year] 70 y.o. 01/14/2016   Principle Diagnosis:   Diffuse large cell non-Hodgkin's lymphoma-clinical remission  Current Therapy:    Observation     Interim History:  Walter [name] is back for a six-month followup. He is doing okay. He still has some occasional back discomfort. He had a flareup of his back back in July. Because this, we got an MRI. 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 I told him that if this becomes a problem again, we could consider a kyphoplasty for him.  He has diabetes. He is watching his blood sugars.  His wife recently had her left knee operated on. Because of this, his appetite and his diet has been a little bit more liberal.  He's had no headache. He's had no visual issues. He's had no swallowing problems.  He has had no problems with bowels or bladder. He is a little bit constipated. He does take MiraLAX.  Overall, his performance status is ECOG 1.   Medications:  Current Outpatient Prescriptions:  .  Ascorbic Acid (VITAMIN C) 1000 MG tablet, Take 1,000 mg by mouth daily., Disp: , Rfl:  .  aspirin EC 81 MG tablet, Take 81 mg by mouth daily., Disp: , Rfl:  .  atorvastatin (LIPITOR) 20 MG tablet, Take 20 mg by mouth daily. , Disp: , Rfl:  .  Cholecalciferol (VITAMIN D) 2000 UNITS tablet, Take 2,000 Units by mouth daily., Disp: , Rfl:  .  docusate sodium (COLACE) 100 MG capsule, Take 100 mg by mouth daily., Disp: , Rfl:  .  loratadine (CLARITIN) 10 MG tablet, Take 10 mg by mouth daily. , Disp: , Rfl:  .  Multiple Vitamins-Minerals (MULTIVITAMIN PO), Take by mouth every morning., Disp: , Rfl:  .  naproxen sodium (ANAPROX) 220 MG tablet, Take 220 mg by mouth every morning. , Disp: , Rfl:  .  traMADol (ULTRAM) 50 MG tablet, 2 tabs daily    100 mg. in AM, Disp: , Rfl:   Allergies:  Allergies  Allergen Reactions  . Penicillins     itching  . Amoxicillin  Hives    Past Medical History, Surgical history, Social history, and Family History were reviewed and updated.  Review of Systems: As above  Physical Exam:  height is 5\' 10"  (1.778 m) and weight is 212 lb (96.2 kg). His temperature is 97.4 F (36.3 C). His blood pressure is 137/93 (abnormal) and his pulse is 75. His respiration is 18.   Well-developed and well-nourished white male. Head and neck exam shows no ocular or oral lesions. He is no palpable cervical or supraclavicular lymph nodes. Lungs are clear. Cardiac exam regular rate and rhythm with no murmurs, rubs or bruits. Abdomen is soft. He has good bowel sounds. There is no guarding or rebound tenderness. There is no fluid wave. There is no palpable liver or spleen tip. Back exam shows no tenderness over the spine, ribs or hips. Extremities shows no clubbing, cyanosis or edema. He has good range of motion of his joints. He has good strength. Skin exam no rashes, ecchymoses or petechia. Neurological exam shows no focal neurological deficits.  Lab Results  Component Value Date   WBC 6.8 01/14/2016   HGB 16.5 01/14/2016   HCT 46.1 01/14/2016   MCV 94 01/14/2016   PLT 155 01/14/2016     Chemistry      Component Value Date/Time   NA  140 07/03/2015 0757   K 4.3 07/03/2015 0757   CL 102 07/04/2014 0807   CO2 26 07/03/2015 0757   BUN 14.0 07/03/2015 0757   CREATININE 0.7 07/03/2015 0757      Component Value Date/Time   CALCIUM 9.0 07/03/2015 0757   ALKPHOS 102 07/03/2015 0757   AST 16 07/03/2015 0757   ALT 17 07/03/2015 0757   BILITOT 0.60 07/03/2015 0757         Impression and Plan: Walter [name] is 70 year old gentleman with a history of a diffuse large cell non-Hodgkin's lymphoma. He's done very nicely. He now is out from his treatments by 51/2 years. He completed all his treatments back in March of 2012.  He is doing pretty well from my point of view.   I told him that If his back bothers him again, we can always  consider a kyphoplasty for that compression fracture. I know this is a chronic compression fracture. However, kyphoplasty should be able to provide some benefit.  I don't see any need for scans.  His blood sugars have been under pretty good control.  I will plan to see him back in 6 months.  [name], MD 9/13/20174:44 PM

## 2016-01-15 LAB — VITAMIN D 25 HYDROXY (VIT D DEFICIENCY, FRACTURES): Vitamin D, 25-Hydroxy: 40.4 ng/mL (ref 30.0–100.0)

## 2016-01-15 LAB — LACTATE DEHYDROGENASE: LDH: 157 U/L (ref 125–245)

## 2016-07-07 ENCOUNTER — Ambulatory Visit (HOSPITAL_BASED_OUTPATIENT_CLINIC_OR_DEPARTMENT_OTHER): Payer: Medicare Other | Admitting: Hematology & Oncology

## 2016-07-07 ENCOUNTER — Other Ambulatory Visit (HOSPITAL_BASED_OUTPATIENT_CLINIC_OR_DEPARTMENT_OTHER): Payer: Medicare Other

## 2016-07-07 ENCOUNTER — Telehealth: Payer: Self-pay | Admitting: *Deleted

## 2016-07-07 VITALS — BP 150/84 | HR 76 | Temp 97.0°F | Resp 20 | Wt 212.8 lb

## 2016-07-07 DIAGNOSIS — Z8572 Personal history of non-Hodgkin lymphomas: Secondary | ICD-10-CM | POA: Diagnosis present

## 2016-07-07 DIAGNOSIS — C8333 Diffuse large B-cell lymphoma, intra-abdominal lymph nodes: Secondary | ICD-10-CM

## 2016-07-07 DIAGNOSIS — E119 Type 2 diabetes mellitus without complications: Secondary | ICD-10-CM

## 2016-07-07 DIAGNOSIS — T387X5A Adverse effect of androgens and anabolic congeners, initial encounter: Secondary | ICD-10-CM

## 2016-07-07 DIAGNOSIS — E0800 Diabetes mellitus due to underlying condition with hyperosmolarity without nonketotic hyperglycemic-hyperosmolar coma (NKHHC): Secondary | ICD-10-CM

## 2016-07-07 DIAGNOSIS — C8353 Lymphoblastic (diffuse) lymphoma, intra-abdominal lymph nodes: Secondary | ICD-10-CM

## 2016-07-07 DIAGNOSIS — E559 Vitamin D deficiency, unspecified: Secondary | ICD-10-CM

## 2016-07-07 DIAGNOSIS — M818 Other osteoporosis without current pathological fracture: Secondary | ICD-10-CM

## 2016-07-07 LAB — COMPREHENSIVE METABOLIC PANEL
ALBUMIN: 4.1 g/dL (ref 3.5–5.0)
ALK PHOS: 119 U/L (ref 40–150)
ALT: 18 U/L (ref 0–55)
ANION GAP: 8 meq/L (ref 3–11)
AST: 17 U/L (ref 5–34)
BILIRUBIN TOTAL: 0.68 mg/dL (ref 0.20–1.20)
BUN: 13.8 mg/dL (ref 7.0–26.0)
CALCIUM: 9.7 mg/dL (ref 8.4–10.4)
CO2: 27 mEq/L (ref 22–29)
CREATININE: 0.7 mg/dL (ref 0.7–1.3)
Chloride: 105 mEq/L (ref 98–109)
Glucose: 118 mg/dl (ref 70–140)
Potassium: 4.4 mEq/L (ref 3.5–5.1)
Sodium: 140 mEq/L (ref 136–145)
TOTAL PROTEIN: 7.4 g/dL (ref 6.4–8.3)

## 2016-07-07 LAB — CBC WITH DIFFERENTIAL (CANCER CENTER ONLY)
BASO#: 0 10*3/uL (ref 0.0–0.2)
BASO%: 0.7 % (ref 0.0–2.0)
EOS%: 1.6 % (ref 0.0–7.0)
Eosinophils Absolute: 0.1 10*3/uL (ref 0.0–0.5)
HCT: 46.8 % (ref 38.7–49.9)
HGB: 16.9 g/dL (ref 13.0–17.1)
LYMPH#: 2 10*3/uL (ref 0.9–3.3)
LYMPH%: 35.1 % (ref 14.0–48.0)
MCH: 34.2 pg — ABNORMAL HIGH (ref 28.0–33.4)
MCHC: 36.1 g/dL — AB (ref 32.0–35.9)
MCV: 95 fL (ref 82–98)
MONO#: 0.4 10*3/uL (ref 0.1–0.9)
MONO%: 7.8 % (ref 0.0–13.0)
NEUT#: 3.1 10*3/uL (ref 1.5–6.5)
NEUT%: 54.8 % (ref 40.0–80.0)
Platelets: 159 10*3/uL (ref 145–400)
RBC: 4.94 10*6/uL (ref 4.20–5.70)
RDW: 12.9 % (ref 11.1–15.7)
WBC: 5.6 10*3/uL (ref 4.0–10.0)

## 2016-07-07 NOTE — Telephone Encounter (Addendum)
Patient aware of results  ----- Message from [name], MD sent at 07/07/2016  2:45 PM EST ----- Call - labs so far look ok!!!  Walter [name]

## 2016-07-07 NOTE — Progress Notes (Signed)
Hematology and Oncology Follow Up Visit  Walter [name] [id number] 08-26-1945 71 y.o. 07/07/2016   Principle Diagnosis:   Diffuse large cell non-Hodgkin's lymphoma-clinical remission  Current Therapy:    Observation     Interim History:  [name] is back for a six-month followup. He is doing okay. He still has some occasional back discomfort. He had a flareup of his back back in July. Because this, we got an MRI. 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 I told him that if this becomes a problem again, we could consider a kyphoplasty for him.  He has diabetes. He is watching his blood sugars.  His wife recently had her left knee operated on. Because of this, his appetite and his diet has been a little bit more liberal.  He's had no headache. He's had no visual issues. He's had no swallowing problems.  He has had no problems with bowels or bladder. He is a little bit constipated. He does take MiraLAX.  Overall, his performance status is ECOG 1.   Medications:  Current Outpatient Prescriptions:  .  Ascorbic Acid (VITAMIN C) 1000 MG tablet, Take 1,000 mg by mouth daily., Disp: , Rfl:  .  aspirin EC 81 MG tablet, Take 81 mg by mouth daily., Disp: , Rfl:  .  atorvastatin (LIPITOR) 20 MG tablet, Take 20 mg by mouth daily. , Disp: , Rfl:  .  Cholecalciferol (VITAMIN D) 2000 UNITS tablet, Take 2,000 Units by mouth daily., Disp: , Rfl:  .  docusate sodium (COLACE) 100 MG capsule, Take 100 mg by mouth daily., Disp: , Rfl:  .  loratadine (CLARITIN) 10 MG tablet, Take 10 mg by mouth daily. , Disp: , Rfl:  .  Multiple Vitamins-Minerals (MULTIVITAMIN PO), Take by mouth every morning., Disp: , Rfl:  .  naproxen sodium (ANAPROX) 220 MG tablet, Take 220 mg by mouth every morning. , Disp: , Rfl:  .  traMADol (ULTRAM) 50 MG tablet, 2 tabs daily    100 mg. in AM, Disp: , Rfl:   Allergies:  Allergies  Allergen Reactions  . Penicillins     itching  . Amoxicillin  Hives    Past Medical History, Surgical history, Social history, and Family History were reviewed and updated.  Review of Systems: As above  Physical Exam:  weight is 212 lb 12.8 oz (96.5 kg). His oral temperature is 97 F (36.1 C). His blood pressure is 150/84 (abnormal) and his pulse is 76. His respiration is 20.   Well-developed and well-nourished white male. Head and neck exam shows no ocular or oral lesions. He is no palpable cervical or supraclavicular lymph nodes. Lungs are clear. Cardiac exam regular rate and rhythm with no murmurs, rubs or bruits. Abdomen is soft. He has good bowel sounds. There is no guarding or rebound tenderness. There is no fluid wave. There is no palpable liver or spleen tip. Back exam shows no tenderness over the spine, ribs or hips. Extremities shows no clubbing, cyanosis or edema. He has good range of motion of his joints. He has good strength. Skin exam no rashes, ecchymoses or petechia. Neurological exam shows no focal neurological deficits.  Lab Results  Component Value Date   WBC 5.6 07/07/2016   HGB 16.9 07/07/2016   HCT 46.8 07/07/2016   MCV 95 07/07/2016   PLT 159 07/07/2016     Chemistry      Component Value Date/Time   NA 137 01/14/2016 1506  NA 140 07/03/2015 0757   K 4.3 01/14/2016 1506   K 4.3 07/03/2015 0757   CL 102 01/14/2016 1506   CL 102 07/04/2014 0807   CO2 25 01/14/2016 1506   CO2 26 07/03/2015 0757   BUN 11 01/14/2016 1506   BUN 14.0 07/03/2015 0757   CREATININE 0.60 (L) 01/14/2016 1506   CREATININE 0.7 07/03/2015 0757      Component Value Date/Time   CALCIUM 9.4 01/14/2016 1506   CALCIUM 9.0 07/03/2015 0757   ALKPHOS 105 01/14/2016 1506   ALKPHOS 102 07/03/2015 0757   AST 20 01/14/2016 1506   AST 16 07/03/2015 0757   ALT 21 01/14/2016 1506   ALT 17 07/03/2015 0757   BILITOT 0.4 01/14/2016 1506   BILITOT 0.60 07/03/2015 0757         Impression and Plan: Walter [name] is 71 year old gentleman with a history  of a diffuse large cell non-Hodgkin's lymphoma. He's done very nicely. He now is out from his treatments by 51/2 years. He completed all his treatments back in March of 2012.  He is doing pretty well from my point of view.   I still think that his the use problems will be blood sugars. I'm not sure how aggressive he is in managing these. I'm sure that his family doctor is doing a great job. We will see what his hemoglobin A1c is.  I will plan to see him back in 6 months.  [name], MD 3/7/201811:41 AM

## 2016-07-08 LAB — HEMOGLOBIN A1C
ESTIMATED AVERAGE GLUCOSE: 126 mg/dL
Hemoglobin A1c: 6 % — ABNORMAL HIGH (ref 4.8–5.6)

## 2016-07-08 LAB — VITAMIN D 25 HYDROXY (VIT D DEFICIENCY, FRACTURES): Vitamin D, 25-Hydroxy: 45.7 ng/mL (ref 30.0–100.0)

## 2017-01-11 ENCOUNTER — Other Ambulatory Visit (HOSPITAL_BASED_OUTPATIENT_CLINIC_OR_DEPARTMENT_OTHER): Payer: Medicare Other

## 2017-01-11 ENCOUNTER — Ambulatory Visit (HOSPITAL_BASED_OUTPATIENT_CLINIC_OR_DEPARTMENT_OTHER): Payer: Medicare Other | Admitting: Hematology & Oncology

## 2017-01-11 VITALS — BP 117/67 | HR 68 | Temp 97.6°F | Resp 17 | Wt 201.0 lb

## 2017-01-11 DIAGNOSIS — E559 Vitamin D deficiency, unspecified: Secondary | ICD-10-CM

## 2017-01-11 DIAGNOSIS — C8353 Lymphoblastic (diffuse) lymphoma, intra-abdominal lymph nodes: Secondary | ICD-10-CM

## 2017-01-11 DIAGNOSIS — R748 Abnormal levels of other serum enzymes: Secondary | ICD-10-CM | POA: Diagnosis not present

## 2017-01-11 DIAGNOSIS — Z8572 Personal history of non-Hodgkin lymphomas: Secondary | ICD-10-CM

## 2017-01-11 LAB — CBC WITH DIFFERENTIAL (CANCER CENTER ONLY)
BASO#: 0 10*3/uL (ref 0.0–0.2)
BASO%: 0.7 % (ref 0.0–2.0)
EOS%: 2.2 % (ref 0.0–7.0)
Eosinophils Absolute: 0.1 10*3/uL (ref 0.0–0.5)
HCT: 45.2 % (ref 38.7–49.9)
HGB: 15.9 g/dL (ref 13.0–17.1)
LYMPH#: 2.2 10*3/uL (ref 0.9–3.3)
LYMPH%: 39.4 % (ref 14.0–48.0)
MCH: 34 pg — ABNORMAL HIGH (ref 28.0–33.4)
MCHC: 35.2 g/dL (ref 32.0–35.9)
MCV: 97 fL (ref 82–98)
MONO#: 0.6 10*3/uL (ref 0.1–0.9)
MONO%: 10.5 % (ref 0.0–13.0)
NEUT#: 2.6 10*3/uL (ref 1.5–6.5)
NEUT%: 47.2 % (ref 40.0–80.0)
Platelets: 146 10*3/uL (ref 145–400)
RBC: 4.67 10*6/uL (ref 4.20–5.70)
RDW: 12.8 % (ref 11.1–15.7)
WBC: 5.5 10*3/uL (ref 4.0–10.0)

## 2017-01-11 LAB — CMP (CANCER CENTER ONLY)
ALBUMIN: 4 g/dL (ref 3.3–5.5)
ALK PHOS: 103 U/L — AB (ref 26–84)
ALT(SGPT): 22 U/L (ref 10–47)
AST: 26 U/L (ref 11–38)
BILIRUBIN TOTAL: 0.9 mg/dL (ref 0.20–1.60)
BUN, Bld: 22 mg/dL (ref 7–22)
CO2: 31 mEq/L (ref 18–33)
Calcium: 9 mg/dL (ref 8.0–10.3)
Chloride: 101 mEq/L (ref 98–108)
Creat: 0.7 mg/dl (ref 0.6–1.2)
Glucose, Bld: 106 mg/dL (ref 73–118)
Potassium: 4.1 mEq/L (ref 3.3–4.7)
SODIUM: 140 meq/L (ref 128–145)
TOTAL PROTEIN: 7.5 g/dL (ref 6.4–8.1)

## 2017-01-11 NOTE — Progress Notes (Signed)
Hematology and Oncology Follow Up Visit  Walter [name] [id number] [birth year] 71 y.o. 01/11/2017   Principle Diagnosis:   Diffuse large cell non-Hodgkin's lymphoma-clinical remission  Current Therapy:    Observation     Interim History:  Walter [name] is back for a six-month followup. He is doing okay. He's had a pretty decent summer. He's been busy doing work outside. He does a lot of work in his yard. 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  He's had no back problems. He's had no fever. He's had no change in bowel or bladder habits.  His blood sugars are doing better. He is on medications for his blood sugars.  He's had no bleeding.  His been no cough or shortness of breath. He's had no headache.  Overall, his performance status is ECOG 0.  Medications:  Current Outpatient Prescriptions:  .  Ascorbic Acid (VITAMIN C) 1000 MG tablet, Take 1,000 mg by mouth daily., Disp: , Rfl:  .  aspirin EC 81 MG tablet, Take 81 mg by mouth daily., Disp: , Rfl:  .  atorvastatin (LIPITOR) 20 MG tablet, Take 20 mg by mouth daily. , Disp: , Rfl:  .  Cholecalciferol (VITAMIN D) 2000 UNITS tablet, Take 2,000 Units by mouth daily., Disp: , Rfl:  .  docusate sodium (COLACE) 100 MG capsule, Take 100 mg by mouth daily., Disp: , Rfl:  .  loratadine (CLARITIN) 10 MG tablet, Take 10 mg by mouth daily. , Disp: , Rfl:  .  Multiple Vitamins-Minerals (MULTIVITAMIN PO), Take by mouth every morning., Disp: , Rfl:  .  naproxen sodium (ANAPROX) 220 MG tablet, Take 220 mg by mouth every morning. , Disp: , Rfl:  .  traMADol (ULTRAM) 50 MG tablet, 2 tabs daily    100 mg. in AM, Disp: , Rfl:   Allergies:  Allergies  Allergen Reactions  . Penicillins     itching  . Amoxicillin Hives    Past Medical History, Surgical history, Social history, and Family History were reviewed and updated.  Review of Systems: As stated in the interim history  Physical Exam:  weight is 201 lb (91.2 kg). His oral temperature is 97.6  F (36.4 C). His blood pressure is 113/66 and his pulse is 68. His respiration is 17 and oxygen saturation is 99%.  Physical Exam  Constitutional: He is oriented to person, place, and time.  HENT:  Head: Normocephalic and atraumatic.  Mouth/Throat: Oropharynx is clear and moist.  Eyes: Pupils are equal, round, and reactive to light. EOM are normal.  Neck: Normal range of motion.  Cardiovascular: Normal rate, regular rhythm and normal heart sounds.   Pulmonary/Chest: Effort normal and breath sounds normal.  Abdominal: Soft. Bowel sounds are normal.  Musculoskeletal: Normal range of motion. He exhibits no edema, tenderness or deformity.  Lymphadenopathy:    He has no cervical adenopathy.  Neurological: He is alert and oriented to person, place, and time.  Skin: Skin is warm and dry. No rash noted. No erythema.  Psychiatric: He has a normal mood and affect. His behavior is normal. Judgment and thought content normal.  Vitals reviewed.    Lab Results  Component Value Date   WBC 5.5 01/11/2017   HGB 15.9 01/11/2017   HCT 45.2 01/11/2017   MCV 97 01/11/2017   PLT 146 01/11/2017     Chemistry      Component Value Date/Time   NA 140 01/11/2017 1033   NA 140 07/07/2016 1050   K 4.1  01/11/2017 1033   K 4.4 07/07/2016 1050   CL 101 01/11/2017 1033   CO2 31 01/11/2017 1033   CO2 27 07/07/2016 1050   BUN 22 01/11/2017 1033   BUN 13.8 07/07/2016 1050   CREATININE 0.7 01/11/2017 1033   CREATININE 0.7 07/07/2016 1050      Component Value Date/Time   CALCIUM 9.0 01/11/2017 1033   CALCIUM 9.7 07/07/2016 1050   ALKPHOS 103 (H) 01/11/2017 1033   ALKPHOS 119 07/07/2016 1050   AST 26 01/11/2017 1033   AST 17 07/07/2016 1050   ALT 22 01/11/2017 1033   ALT 18 07/07/2016 1050   BILITOT 0.90 01/11/2017 1033   BILITOT 0.68 07/07/2016 1050         Impression and Plan: Walter [name] is 71 year old gentleman with a history of a diffuse large cell non-Hodgkin's lymphoma. He's done very  nicely. He now is out from his treatments by 51/2 years. He completed all his treatments back in March of 2012.  He is doing pretty well from my point of view.   I'm glad that his blood sugars are doing better. I think this will be his big problem long-term.  I'm not too worried about the alkaline phosphatase being elevated. This has been chronically elevated. I suspect he probably has some hepatic steatosis.  We will plan to get him back in 6 more months.  [name], MD 9/11/201811:42 AM

## 2017-01-12 ENCOUNTER — Telehealth: Payer: Self-pay | Admitting: *Deleted

## 2017-01-12 LAB — VITAMIN D 25 HYDROXY (VIT D DEFICIENCY, FRACTURES): Vitamin D, 25-Hydroxy: 52.6 ng/mL (ref 30.0–100.0)

## 2017-01-12 NOTE — Telephone Encounter (Addendum)
Patient's wife is aware of results  ----- Message from [name], MD sent at 01/12/2017  6:33 AM EDT ----- Call - the vit D level is quite good!!!!  No change in dosage!!!  Walter [name]

## 2017-02-16 ENCOUNTER — Ambulatory Visit
Admission: EM | Admit: 2017-02-16 | Discharge: 2017-02-16 | Disposition: A | Discharge status: Home / Self Care | Payer: Medicare Other | Attending: Family Medicine | Admitting: Family Medicine

## 2017-02-16 ENCOUNTER — Encounter: Payer: Self-pay | Admitting: *Deleted

## 2017-02-16 DIAGNOSIS — S81811A Laceration without foreign body, right lower leg, initial encounter: Secondary | ICD-10-CM | POA: Diagnosis not present

## 2017-02-16 DIAGNOSIS — W268XXA Contact with other sharp object(s), not elsewhere classified, initial encounter: Secondary | ICD-10-CM

## 2017-02-16 DIAGNOSIS — Z23 Encounter for immunization: Secondary | ICD-10-CM | POA: Diagnosis not present

## 2017-02-16 MED ORDER — DOXYCYCLINE HYCLATE 100 MG PO CAPS: 100.0000 mg | ORAL_CAPSULE | Freq: Two times a day (BID) | ORAL | 0 refills | Status: AC

## 2017-02-16 MED ORDER — TETANUS-DIPHTH-ACELL PERTUSSIS 5-2.5-18.5 LF-MCG/0.5 IM SUSP
0.5000 mL | Freq: Once | INTRAMUSCULAR | Status: AC
  Administered 2017-02-16: 0.5 mL via INTRAMUSCULAR

## 2017-02-16 NOTE — Discharge Instructions (Signed)
Keep the dressing applied today for 2 days. Keep dressing dry and clean x 48 hrs. Gently Wash with soap and water after 48 hrs and make sure dry the wound site completely. Cover with non stick dressing if stepping out of house Avoid strenuous activity involving right leg until suture removal.  Take antibiotic as prescribed. Suture removal x 10 days.

## 2017-02-16 NOTE — ED Triage Notes (Signed)
  Pt was working on an aluminum fan blade and blade struck right lower leg. Now c/o 2 - 3" lacerations and an abrasion to right lower leg. Bleeding controlled.

## 2017-02-16 NOTE — ED Provider Notes (Signed)
[facility]    CSN: [id number] Arrival date & time: 02/16/17  1234     History   Chief Complaint Chief Complaint  Patient presents with  . Laceration    HPI Walter [name] is a 71 y.o. male presented with laceration to right lower leg. Pt states accidentally got stuck with aluminium fan blade. 2 linear, horizontal lacerations present to right lower leg about 8 cm and 6 cm with flap on the bottom laceration. Lower laceration had gaping wound with subcutaneous tissue exposed. No injury to muscle noted. Flexion and extension of foot intact. Sensation intact to leg and foot. Bleeding controlled. Pt unsure of last tetanus. Td given.    The history is provided by the patient.    Past Medical History:  Diagnosis Date  . Cancer ([place])    NHL  . Cardiomegaly   . Hypercholesteremia   . Lumbar spondylolysis   . Shortness of breath     Patient Active Problem List   Diagnosis Date Noted  . 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 Vitamin D deficiency 07/07/2016  . NHL (non-Hodgkin's lymphoma) ([place]) 05/21/2011    Past Surgical History:  Procedure Laterality Date  . none    . PORTACATH PLACEMENT  10/2009   Via IR       Home Medications    Prior to Admission medications   Medication Sig Start Date End Date Taking? Authorizing Provider  Ascorbic Acid (VITAMIN C) 1000 MG tablet Take 1,000 mg by mouth daily.   Yes [provider]  aspirin EC 81 MG tablet Take 81 mg by mouth daily.   Yes [provider]  atorvastatin (LIPITOR) 20 MG tablet Take 20 mg by mouth daily.  05/11/11  Yes [provider]  Cholecalciferol (VITAMIN D) 2000 UNITS tablet Take 2,000 Units by mouth daily.   Yes [provider]  docusate sodium (COLACE) 100 MG capsule Take 100 mg by mouth daily.   Yes [provider]  loratadine (CLARITIN) 10 MG tablet Take 10 mg by mouth daily.    Yes [provider]  Multiple  Vitamins-Minerals (MULTIVITAMIN PO) Take by mouth every morning.   Yes [provider]  naproxen sodium (ANAPROX) 220 MG tablet Take 220 mg by mouth every morning.    Yes [provider]  traMADol (ULTRAM) 50 MG tablet 2 tabs daily    100 mg. in AM 09/22/11  Yes [provider]  doxycycline (VIBRAMYCIN) 100 MG capsule Take 1 capsule (100 mg total) by mouth 2 (two) times daily. 02/16/17 02/23/17  Jannatul Wojdyla, NP    Family History Family History  Problem Relation Age of Onset  . Heart attack Unknown   . Cancer Unknown     Social History Social History  Substance Use Topics  . Smoking status: Never Smoker  . Smokeless tobacco: Never Used     Comment: never used tobacco  . Alcohol use No     Allergies   Penicillins and Amoxicillin   Review of Systems Review of Systems  Constitutional: Negative.   HENT: Negative.   Respiratory: Negative.   Cardiovascular: Negative.   Musculoskeletal: Negative.   Skin: Positive for wound (laceration to right lower leg).  Neurological: Negative.      Physical Exam Triage Vital Signs ED Triage Vitals  Enc Vitals Group     BP 02/16/17 1302 123/69     Pulse Rate 02/16/17 1302 73     Resp 02/16/17 1302 16  Temp 02/16/17 1302 (!) 97.5 F (36.4 C)     Temp Source 02/16/17 1302 Oral     SpO2 02/16/17 1302 98 %     Weight 02/16/17 1305 194 lb (88 kg)     Height 02/16/17 1305 5\' 10"  (1.778 m)     Head Circumference --      Peak Flow --      Pain Score --      Pain Loc --      Pain Edu? --      Excl. in [place]? --    No data found.   Updated Vital Signs BP 123/69 (BP Location: Left Arm)   Pulse 73   Temp (!) 97.5 F (36.4 C) (Oral)   Resp 16   Ht 5\' 10"  (1.778 m)   Wt 194 lb (88 kg)   SpO2 98%   BMI 27.84 kg/m   Visual Acuity Right Eye Distance:   Left Eye Distance:   Bilateral Distance:    Right Eye Near:   Left Eye Near:    Bilateral Near:     Physical Exam  Constitutional: He is  oriented to person, place, and time. He appears well-developed and well-nourished. No distress.  HENT:  Head: Normocephalic.  Eyes: Pupils are equal, round, and reactive to light.  Cardiovascular: Normal rate.   Pulmonary/Chest: Effort normal and breath sounds normal.  Musculoskeletal: Normal range of motion.  Neurological: He is alert and oriented to person, place, and time.  Skin: Skin is warm. He is not diaphoretic.        UC Treatments / Results  Labs (all labs ordered are listed, but only abnormal results are displayed) Labs Reviewed - No data to display  EKG  EKG Interpretation None       Radiology No results found.  Procedures .Marland KitchenLaceration Repair Date/Time: 02/16/2017 2:58 PM Performed by: Teola Bradley Authorized by: Norval Gable   Consent:    Consent obtained:  Verbal   Consent given by:  Patient   Risks discussed:  Infection, pain, retained foreign body, need for additional repair, poor cosmetic result, poor wound healing and vascular damage Laceration details:    Location:  Leg   Leg location:  R lower leg   Length (cm):  8 (2 parallel horizontal laceration to rt lower leg 8 am and 6 cm respectively.  Flap noted to edge of lower laceration)   Laceration depth: subcutaneous tissue exposed  Repair type:    Repair type:  Complex Pre-procedure details:    Preparation:  Patient was prepped and draped in usual sterile fashion Exploration:    Limited defect created (wound extended): yes     Hemostasis achieved with:  Direct pressure   Wound exploration: wound explored through full range of motion and entire depth of wound probed and visualized     Wound extent: no fascia violation noted, no muscle damage noted, no nerve damage noted, no tendon damage noted, no underlying fracture noted and no vascular damage noted     Contaminated: no   Treatment:    Area cleansed with:  Betadine and saline   Amount of cleaning:  Extensive   Irrigation solution:   Sterile water   Irrigation volume:  30 Ml's    Irrigation method:  Syringe   Visualized foreign bodies/material removed: no     Debridement:  Moderate Subcutaneous repair:    Number of subcutaneous sutures: 0 Horizontal mattress sutures to lower laceration and 2 simple interrupted and 6 mattress sutures  to upper laceration on right lower leg. Skin repair:    Repair method:  Sutures   Suture size:  4-0   Suture material:  Nylon   Suture technique:  Horizontal mattress   Number of sutures:  18 (0 Horizontal mattress sutures to lower laceration and 2 simple interrupted and 6 mattress sutures to upper laceration on right lower leg) Approximation:    Approximation:  Close   Vermilion border: well-aligned   Post-procedure details:    Dressing:  Non-adherent dressing, sterile dressing, bulky dressing and adhesive bandage   Patient tolerance of procedure:  Tolerated well, no immediate complications    (including critical care time)  Medications Ordered in UC Medications  Tdap (BOOSTRIX) injection 0.5 mL (0.5 mLs Intramuscular Given 02/16/17 1314)     Initial Impression / Assessment and Plan / UC Course  I have reviewed the triage vital signs and the nursing notes.  Pertinent labs & imaging results that were available during my care of the patient were reviewed by me and considered in my medical decision making (see chart for details).    Wound repair done: 10 Horizontal mattress sutures to lower laceration and 2 simple interrupted and 6 mattress sutures to upper laceration on right lower leg. Td given. Suture removal x 10 days.  Final Clinical Impressions(s) / UC Diagnoses   Final diagnoses:  Leg laceration, right, initial encounter    New Prescriptions New Prescriptions   DOXYCYCLINE (VIBRAMYCIN) 100 MG CAPSULE    Take 1 capsule (100 mg total) by mouth 2 (two) times daily.     Controlled Substance Prescriptions [place] Controlled Substance Registry consulted? Not Applicable     Teola Bradley, NP 02/16/17 1514

## 2017-02-26 ENCOUNTER — Ambulatory Visit
Admission: EM | Admit: 2017-02-26 | Discharge: 2017-02-26 | Disposition: A | Discharge status: Home / Self Care | Payer: Medicare Other | Attending: Family Medicine | Admitting: Family Medicine

## 2017-02-26 ENCOUNTER — Encounter: Payer: Self-pay | Admitting: Gynecology

## 2017-02-26 DIAGNOSIS — Z5189 Encounter for other specified aftercare: Secondary | ICD-10-CM | POA: Diagnosis not present

## 2017-02-26 DIAGNOSIS — Z4802 Encounter for removal of sutures: Secondary | ICD-10-CM | POA: Diagnosis not present

## 2017-02-26 NOTE — ED Triage Notes (Signed)
Patient follow up for suture removal on his right lower leg form laceration on 02/16/2017 x 10 days.

## 2017-02-26 NOTE — ED Provider Notes (Signed)
[facility]    CSN: [id number] Arrival date & time: 02/26/17  1436     History   Chief Complaint Chief Complaint  Patient presents with  . Suture / Staple Removal    HPI Walter [name] is a 71 y.o. male.   71 yo male with a right lower leg laceration here for wound recheck and suture removal. No new complaints or concerns. Denies any fevers, chills, drainage.    The history is provided by the patient.  Suture / Staple Removal     Past Medical History:  Diagnosis Date  . Cancer ([place])    NHL  . Cardiomegaly   . Hypercholesteremia   . Lumbar spondylolysis   . Shortness of breath     Patient Active Problem List   Diagnosis Date Noted  . 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 Vitamin D deficiency 07/07/2016  . NHL (non-Hodgkin's lymphoma) ([place]) 05/21/2011    Past Surgical History:  Procedure Laterality Date  . none    . PORTACATH PLACEMENT  10/2009   Via IR       Home Medications    Prior to Admission medications   Medication Sig Start Date End Date Taking? Authorizing Provider  Ascorbic Acid (VITAMIN C) 1000 MG tablet Take 1,000 mg by mouth daily.   Yes [provider]  aspirin EC 81 MG tablet Take 81 mg by mouth daily.   Yes [provider]  atorvastatin (LIPITOR) 20 MG tablet Take 20 mg by mouth daily.  05/11/11  Yes [provider]  Cholecalciferol (VITAMIN D) 2000 UNITS tablet Take 2,000 Units by mouth daily.   Yes [provider]  docusate sodium (COLACE) 100 MG capsule Take 100 mg by mouth daily.   Yes [provider]  loratadine (CLARITIN) 10 MG tablet Take 10 mg by mouth daily.    Yes [provider]  Multiple Vitamins-Minerals (MULTIVITAMIN PO) Take by mouth every morning.   Yes [provider]  naproxen sodium (ANAPROX) 220 MG tablet Take 220 mg by mouth every morning.    Yes [provider]  traMADol (ULTRAM) 50 MG tablet 2  tabs daily    100 mg. in AM 09/22/11  Yes [provider]    Family History Family History  Problem Relation Age of Onset  . Heart attack Unknown   . Cancer Unknown     Social History Social History  Substance Use Topics  . Smoking status: Never Smoker  . Smokeless tobacco: Never Used     Comment: never used tobacco  . Alcohol use No     Allergies   Penicillins and Amoxicillin   Review of Systems Review of Systems   Physical Exam Triage Vital Signs ED Triage Vitals  Enc Vitals Group     BP 02/26/17 1532 117/83     Pulse Rate 02/26/17 1532 64     Resp 02/26/17 1532 16     Temp 02/26/17 1532 98.3 F (36.8 C)     Temp Source 02/26/17 1532 Oral     SpO2 02/26/17 1532 96 %     Weight 02/26/17 1531 194 lb (88 kg)     Height --      Head Circumference --      Peak Flow --      Pain Score 02/26/17 1531 0     Pain Loc --      Pain Edu? --      Excl. in [place]? --  No data found.   Updated Vital Signs BP 117/83 (BP Location: Left Arm)   Pulse 64   Temp 98.3 F (36.8 C) (Oral)   Resp 16   Wt 194 lb (88 kg)   SpO2 96%   BMI 27.84 kg/m   Visual Acuity Right Eye Distance:   Left Eye Distance:   Bilateral Distance:    Right Eye Near:   Left Eye Near:    Bilateral Near:     Physical Exam  Constitutional: He appears well-developed.  Skin:  Right lower extremity laceration well healed; no drainage; no tenderness  Nursing note and vitals reviewed.    UC Treatments / Results  Labs (all labs ordered are listed, but only abnormal results are displayed) Labs Reviewed - No data to display  EKG  EKG Interpretation None       Radiology No results found.  Procedures Procedures (including critical care time)  Medications Ordered in UC Medications - No data to display   Initial Impression / Assessment and Plan / UC Course  I have reviewed the triage vital signs and the nursing notes.  Pertinent labs & imaging results that were available  during my care of the patient were reviewed by me and considered in my medical decision making (see chart for details).       Final Clinical Impressions(s) / UC Diagnoses   Final diagnoses:  Encounter for wound re-check  Encounter for removal of sutures    New Prescriptions Discharge Medication List as of 02/26/2017  4:22 PM    1.  diagnosis reviewed with patient; healed laceration; sutures removed 2. Recommend supportive treatment with continued routine wound care 3. Follow-up prn if symptoms worsen or don't improve  Controlled Substance Prescriptions [place] Controlled Substance Registry consulted? Not Applicable   Norval Gable, MD 02/26/17 [phone]

## 2017-02-26 NOTE — ED Notes (Signed)
16 sutures removed by Rosendo Gros, CMA from right lower leg. Steri strips placed by [employer], Therapist, sports. Telfa dressing applied and wrapped in [place].

## 2017-07-12 ENCOUNTER — Other Ambulatory Visit: Payer: Self-pay

## 2017-07-12 ENCOUNTER — Encounter: Payer: Self-pay | Admitting: Hematology & Oncology

## 2017-07-12 ENCOUNTER — Inpatient Hospital Stay: Payer: Medicare Other | Attending: Hematology & Oncology

## 2017-07-12 ENCOUNTER — Inpatient Hospital Stay (HOSPITAL_BASED_OUTPATIENT_CLINIC_OR_DEPARTMENT_OTHER): Payer: Medicare Other | Admitting: Hematology & Oncology

## 2017-07-12 ENCOUNTER — Telehealth: Payer: Self-pay

## 2017-07-12 VITALS — BP 151/82 | HR 81 | Temp 97.4°F | Resp 16 | Wt 201.0 lb

## 2017-07-12 DIAGNOSIS — C8338 Diffuse large B-cell lymphoma, lymph nodes of multiple sites: Secondary | ICD-10-CM

## 2017-07-12 DIAGNOSIS — I10 Essential (primary) hypertension: Secondary | ICD-10-CM

## 2017-07-12 DIAGNOSIS — E559 Vitamin D deficiency, unspecified: Secondary | ICD-10-CM

## 2017-07-12 DIAGNOSIS — Z8572 Personal history of non-Hodgkin lymphomas: Secondary | ICD-10-CM

## 2017-07-12 DIAGNOSIS — C8353 Lymphoblastic (diffuse) lymphoma, intra-abdominal lymph nodes: Secondary | ICD-10-CM

## 2017-07-12 LAB — CBC WITH DIFFERENTIAL (CANCER CENTER ONLY)
BASOS ABS: 0 10*3/uL (ref 0.0–0.1)
BASOS PCT: 1 %
EOS ABS: 0.1 10*3/uL (ref 0.0–0.5)
Eosinophils Relative: 2 %
HEMATOCRIT: 48 % (ref 38.7–49.9)
HEMOGLOBIN: 16.7 g/dL (ref 13.0–17.1)
Lymphocytes Relative: 39 %
Lymphs Abs: 2 10*3/uL (ref 0.9–3.3)
MCH: 33.6 pg — ABNORMAL HIGH (ref 28.0–33.4)
MCHC: 34.8 g/dL (ref 32.0–35.9)
MCV: 96.6 fL (ref 82.0–98.0)
Monocytes Absolute: 0.5 10*3/uL (ref 0.1–0.9)
Monocytes Relative: 10 %
NEUTROS ABS: 2.5 10*3/uL (ref 1.5–6.5)
NEUTROS PCT: 48 %
Platelet Count: 143 10*3/uL — ABNORMAL LOW (ref 145–400)
RBC: 4.97 MIL/uL (ref 4.20–5.70)
RDW: 13 % (ref 11.1–15.7)
WBC: 5.2 10*3/uL (ref 4.0–10.0)

## 2017-07-12 LAB — CMP (CANCER CENTER ONLY)
ALBUMIN: 4 g/dL (ref 3.5–5.0)
ALK PHOS: 94 U/L — AB (ref 26–84)
ALT: 30 U/L (ref 10–47)
AST: 26 U/L (ref 11–38)
Anion gap: 11 (ref 5–15)
BILIRUBIN TOTAL: 1 mg/dL (ref 0.2–1.6)
BUN: 15 mg/dL (ref 7–22)
CHLORIDE: 102 mmol/L (ref 98–108)
CO2: 32 mmol/L (ref 18–33)
CREATININE: 0.9 mg/dL (ref 0.60–1.20)
Calcium: 9.4 mg/dL (ref 8.0–10.3)
Glucose, Bld: 107 mg/dL (ref 73–118)
Potassium: 4.1 mmol/L (ref 3.3–4.7)
Sodium: 145 mmol/L (ref 128–145)
Total Protein: 7.2 g/dL (ref 6.4–8.1)

## 2017-07-12 LAB — LACTATE DEHYDROGENASE: LDH: 148 U/L (ref 125–245)

## 2017-07-12 NOTE — Telephone Encounter (Addendum)
-----   Message from [name], MD sent at 07/12/2017 12:27 PM EDT ----- Call - chemistry studies are ok!!  Blood sugar is 109!!!  pete  Above message left on home VM with instructions to contact office with questions/concerns. dph

## 2017-07-12 NOTE — Progress Notes (Signed)
Hematology and Oncology Follow Up Visit  Walter [name] [id number] [birth year] 72 y.o. 07/12/2017   Principle Diagnosis:   Diffuse large cell non-Hodgkin's lymphoma-clinical remission  Current Therapy:    Observation     Interim History:  Walter [name] is back for a six-month followup.  He is doing okay.  His only complaint has been a little bit of dizziness in the morning.   I told him that I really thought that he would benefit from being on a blood pressure agent.  His doctor gave him Cozaar which I think is a very good idea.  I told him that I thought this would be beneficial.  Is back occasionally bothers him.  This usually happens when he is doing a lot of work around the yard.  Para graph he has had a good appetite.  He does have hyperglycemia but he does not really check his blood sugars at home.  Overall, his performance status is ECOG 0.  M dications:  Current Outpatient Medications:  .  Ascorbic Acid (VITAMIN C) 1000 MG tablet, Take 1,000 mg by mouth daily., Disp: , Rfl:  .  aspirin EC 81 MG tablet, Take 81 mg by mouth daily., Disp: , Rfl:  .  atorvastatin (LIPITOR) 20 MG tablet, Take 20 mg by mouth daily. , Disp: , Rfl:  .  Cholecalciferol (VITAMIN D) 2000 UNITS tablet, Take 2,000 Units by mouth daily., Disp: , Rfl:  .  docusate sodium (COLACE) 100 MG capsule, Take 100 mg by mouth daily., Disp: , Rfl:  .  loratadine (CLARITIN) 10 MG tablet, Take 10 mg by mouth daily. , Disp: , Rfl:  .  Multiple Vitamins-Minerals (MULTIVITAMIN PO), Take by mouth every morning., Disp: , Rfl:  .  naproxen sodium (ANAPROX) 220 MG tablet, Take 220 mg by mouth every morning. , Disp: , Rfl:  .  traMADol (ULTRAM) 50 MG tablet, 2 tabs daily    100 mg. in AM, Disp: , Rfl:   Allergies:  Allergies  Allergen Reactions  . Penicillins Hives    itching  . Amoxicillin Hives    Past Medical History, Surgical history, Social history, and Family History were reviewed and updated.  Review of  Systems: As stated in the interim history  Physical Exam:  weight is 201 lb (91.2 kg). His oral temperature is 97.4 F (36.3 C) (abnormal). His blood pressure is 151/82 (abnormal) and his pulse is 81. His respiration is 16 and oxygen saturation is 98%.  Physical Exam  Constitutional: He is oriented to person, place, and time.  HENT:  Head: Normocephalic and atraumatic.  Mouth/Throat: Oropharynx is clear and moist.  Eyes: EOM are normal. Pupils are equal, round, and reactive to light.  Neck: Normal range of motion.  Cardiovascular: Normal rate, regular rhythm and normal heart sounds.  Pulmonary/Chest: Effort normal and breath sounds normal.  Abdominal: Soft. Bowel sounds are normal.  Musculoskeletal: Normal range of motion. He exhibits no edema, tenderness or deformity.  Lymphadenopathy:    He has no cervical adenopathy.  Neurological: He is alert and oriented to person, place, and time.  Skin: Skin is warm and dry. No rash noted. No erythema.  Psychiatric: He has a normal mood and affect. His behavior is normal. Judgment and thought content normal.  Vitals reviewed.    Lab Results  Component Value Date   WBC 5.2 07/12/2017   HGB 15.9 01/11/2017   HCT 48.0 07/12/2017   MCV 96.6 07/12/2017   PLT 143 (L) 07/12/2017  Chemistry      Component Value Date/Time   NA 145 07/12/2017 0947   NA 140 01/11/2017 1033   NA 140 07/07/2016 1050   K 4.1 07/12/2017 0947   K 4.1 01/11/2017 1033   K 4.4 07/07/2016 1050   CL 102 07/12/2017 0947   CL 101 01/11/2017 1033   CO2 32 07/12/2017 0947   CO2 31 01/11/2017 1033   CO2 27 07/07/2016 1050   BUN 15 07/12/2017 0947   BUN 22 01/11/2017 1033   BUN 13.8 07/07/2016 1050   CREATININE 0.90 07/12/2017 0947   CREATININE 0.7 01/11/2017 1033   CREATININE 0.7 07/07/2016 1050      Component Value Date/Time   CALCIUM 9.4 07/12/2017 0947   CALCIUM 9.0 01/11/2017 1033   CALCIUM 9.7 07/07/2016 1050   ALKPHOS 94 (H) 07/12/2017 0947   ALKPHOS  103 (H) 01/11/2017 1033   ALKPHOS 119 07/07/2016 1050   AST 26 07/12/2017 0947   AST 17 07/07/2016 1050   ALT 30 07/12/2017 0947   ALT 22 01/11/2017 1033   ALT 18 07/07/2016 1050   BILITOT 1.0 07/12/2017 0947   BILITOT 0.68 07/07/2016 1050         Impression and Plan: Walter [name] is 72 year old gentleman with a history of a diffuse large cell non-Hodgkin's lymphoma. He's done very nicely. He now is out from his treatments by 51/2 years. He completed all his treatments back in March of 2012.  He is doing pretty well from my point of view.  He will take his blood pressure medication.  I really think this is a wonderful idea.  I try to reinforce that the blood pressure medicine will help him.  As always, we will get him back in 6 months we will see his wife.   [name], MD 3/12/201911:21 AM

## 2017-07-13 LAB — VITAMIN D 25 HYDROXY (VIT D DEFICIENCY, FRACTURES): VIT D 25 HYDROXY: 45.1 ng/mL (ref 30.0–100.0)

## 2018-01-10 ENCOUNTER — Inpatient Hospital Stay: Payer: Medicare Other

## 2018-01-10 ENCOUNTER — Ambulatory Visit: Payer: Medicare Other

## 2018-01-10 ENCOUNTER — Inpatient Hospital Stay: Payer: Medicare Other | Attending: Hematology & Oncology | Admitting: Hematology & Oncology

## 2018-01-10 VITALS — BP 109/71 | HR 73 | Temp 98.2°F | Resp 17 | Wt 198.5 lb

## 2018-01-10 DIAGNOSIS — E559 Vitamin D deficiency, unspecified: Secondary | ICD-10-CM

## 2018-01-10 DIAGNOSIS — C8205 Follicular lymphoma grade I, lymph nodes of inguinal region and lower limb: Secondary | ICD-10-CM

## 2018-01-10 DIAGNOSIS — C8338 Diffuse large B-cell lymphoma, lymph nodes of multiple sites: Secondary | ICD-10-CM

## 2018-01-10 DIAGNOSIS — Z79899 Other long term (current) drug therapy: Secondary | ICD-10-CM | POA: Diagnosis not present

## 2018-01-10 DIAGNOSIS — Z8572 Personal history of non-Hodgkin lymphomas: Secondary | ICD-10-CM | POA: Diagnosis present

## 2018-01-10 LAB — CMP (CANCER CENTER ONLY)
ALK PHOS: 79 U/L (ref 38–126)
ALT: 22 U/L (ref 0–44)
ANION GAP: 12 (ref 5–15)
AST: 22 U/L (ref 15–41)
Albumin: 4.4 g/dL (ref 3.5–5.0)
BUN: 16 mg/dL (ref 8–23)
CALCIUM: 10.3 mg/dL (ref 8.9–10.3)
CO2: 28 mmol/L (ref 22–32)
CREATININE: 0.79 mg/dL (ref 0.61–1.24)
Chloride: 103 mmol/L (ref 98–111)
GFR, Est AFR Am: >60 mL/min (ref >60)
Glucose, Bld: 114 mg/dL — ABNORMAL HIGH (ref 70–99)
Potassium: 4.7 mmol/L (ref 3.5–5.1)
SODIUM: 143 mmol/L (ref 135–145)
Total Bilirubin: 0.8 mg/dL (ref 0.3–1.2)
Total Protein: 7.9 g/dL (ref 6.5–8.1)

## 2018-01-10 LAB — CBC WITH DIFFERENTIAL (CANCER CENTER ONLY)
Basophils Absolute: 0 10*3/uL (ref 0.0–0.1)
Basophils Relative: 1 %
EOS ABS: 0.1 10*3/uL (ref 0.0–0.5)
EOS PCT: 1 %
HCT: 48.1 % (ref 38.7–49.9)
Hemoglobin: 16.5 g/dL (ref 13.0–17.1)
LYMPHS ABS: 1.8 10*3/uL (ref 0.9–3.3)
LYMPHS PCT: 29 %
MCH: 33.8 pg — AB (ref 28.0–33.4)
MCHC: 34.3 g/dL (ref 32.0–35.9)
MCV: 98.6 fL — AB (ref 82.0–98.0)
MONO ABS: 0.5 10*3/uL (ref 0.1–0.9)
Monocytes Relative: 8 %
Neutro Abs: 4 10*3/uL (ref 1.5–6.5)
Neutrophils Relative %: 61 %
PLATELETS: 151 10*3/uL (ref 145–400)
RBC: 4.88 MIL/uL (ref 4.20–5.70)
RDW: 12.6 % (ref 11.1–15.7)
WBC Count: 6.4 10*3/uL (ref 4.0–10.0)

## 2018-01-10 LAB — LACTATE DEHYDROGENASE: LDH: 168 U/L (ref 98–192)

## 2018-01-10 NOTE — Progress Notes (Signed)
Hematology and Oncology Follow Up Visit  Walter [name] [id number] 06-22-1945 72 y.o. 01/10/2018   Principle Diagnosis:   Diffuse large cell non-Hodgkin's lymphoma-clinical remission  Current Therapy:    Observation     Interim History:  Mr.  Walter [name] is back for a six-month followup.  He is doing okay.  He had a good summer.  He had been doing too much outside because of the heat.  His blood pressure is doing a whole lot better now.  He is on Cozaar.  His back is doing okay.  He has had no problems with bowels or bladder.  He is had no leg swelling.  There is been no rashes.  He has occasional dizziness but this might be more reflection of low hydration status.    Overall, his performance status is ECOG 0.  M dications:  Current Outpatient Medications:  .  acetaminophen (TYLENOL) 500 MG tablet, Take 1,000 mg by mouth at bedtime as needed., Disp: , Rfl:  .  lisinopril-hydrochlorothiazide (PRINZIDE,ZESTORETIC) 10-12.5 MG tablet, Take 1 tablet by mouth daily., Disp: , Rfl:  .  Ascorbic Acid (VITAMIN C) 1000 MG tablet, Take 1,000 mg by mouth daily., Disp: , Rfl:  .  aspirin EC 81 MG tablet, Take 81 mg by mouth daily., Disp: , Rfl:  .  atorvastatin (LIPITOR) 20 MG tablet, Take 20 mg by mouth daily. , Disp: , Rfl:  .  Cholecalciferol (VITAMIN D) 2000 UNITS tablet, Take 2,000 Units by mouth daily., Disp: , Rfl:  .  docusate sodium (COLACE) 100 MG capsule, Take 100 mg by mouth daily., Disp: , Rfl:  .  loratadine (CLARITIN) 10 MG tablet, Take 10 mg by mouth daily. , Disp: , Rfl:  .  Multiple Vitamins-Minerals (MULTIVITAMIN PO), Take by mouth every morning., Disp: , Rfl:  .  naproxen sodium (ANAPROX) 220 MG tablet, Take 220 mg by mouth every morning. , Disp: , Rfl:  .  traMADol (ULTRAM) 50 MG tablet, 2 tabs daily    100 mg. in AM, Disp: , Rfl:   Allergies:  Allergies  Allergen Reactions  . Penicillins Hives    itching  . Amoxicillin Hives    Past Medical History, Surgical  history, Social history, and Family History were reviewed and updated.  Review of Systems: As stated in the interim history  Physical Exam:  weight is 198 lb 8 oz (90 kg). His oral temperature is 98.2 F (36.8 C). His blood pressure is 109/71 and his pulse is 73. His respiration is 17.  Physical Exam  Constitutional: He is oriented to person, place, and time.  HENT:  Head: Normocephalic and atraumatic.  Mouth/Throat: Oropharynx is clear and moist.  Eyes: Pupils are equal, round, and reactive to light. EOM are normal.  Neck: Normal range of motion.  Cardiovascular: Normal rate, regular rhythm and normal heart sounds.  Pulmonary/Chest: Effort normal and breath sounds normal.  Abdominal: Soft. Bowel sounds are normal.  Musculoskeletal: Normal range of motion. He exhibits no edema, tenderness or deformity.  Lymphadenopathy:    He has no cervical adenopathy.  Neurological: He is alert and oriented to person, place, and time.  Skin: Skin is warm and dry. No rash noted. No erythema.  Psychiatric: He has a normal mood and affect. His behavior is normal. Judgment and thought content normal.  Vitals reviewed.    Lab Results  Component Value Date   WBC 6.4 01/10/2018   HGB 16.5 01/10/2018   HCT 48.1 01/10/2018   MCV 98.6 (  H) 01/10/2018   PLT 151 01/10/2018     Chemistry      Component Value Date/Time   NA 145 07/12/2017 0947   NA 140 01/11/2017 1033   NA 140 07/07/2016 1050   K 4.1 07/12/2017 0947   K 4.1 01/11/2017 1033   K 4.4 07/07/2016 1050   CL 102 07/12/2017 0947   CL 101 01/11/2017 1033   CO2 32 07/12/2017 0947   CO2 31 01/11/2017 1033   CO2 27 07/07/2016 1050   BUN 15 07/12/2017 0947   BUN 22 01/11/2017 1033   BUN 13.8 07/07/2016 1050   CREATININE 0.90 07/12/2017 0947   CREATININE 0.7 01/11/2017 1033   CREATININE 0.7 07/07/2016 1050      Component Value Date/Time   CALCIUM 9.4 07/12/2017 0947   CALCIUM 9.0 01/11/2017 1033   CALCIUM 9.7 07/07/2016 1050    ALKPHOS 94 (H) 07/12/2017 0947   ALKPHOS 103 (H) 01/11/2017 1033   ALKPHOS 119 07/07/2016 1050   AST 26 07/12/2017 0947   AST 17 07/07/2016 1050   ALT 30 07/12/2017 0947   ALT 22 01/11/2017 1033   ALT 18 07/07/2016 1050   BILITOT 1.0 07/12/2017 0947   BILITOT 0.68 07/07/2016 1050         Impression and Plan: Mr. Walter [name] is 72 year old gentleman with a history of a diffuse large cell non-Hodgkin's lymphoma. He's done very nicely. He now is out from his treatments by 7 1/2 years. He completed all his treatments back in March of 2012.  He is doing pretty well from my point of view.  I do not need any x-rays.  He does not need any additional studies.  We will plan to get him back in another 6 months.   [name], MD 9/10/201911:56 AM

## 2018-07-14 ENCOUNTER — Encounter: Payer: Self-pay | Admitting: Hematology & Oncology

## 2018-07-18 ENCOUNTER — Inpatient Hospital Stay: Payer: Medicare Other | Attending: Hematology & Oncology | Admitting: Hematology & Oncology

## 2018-07-18 ENCOUNTER — Encounter: Payer: Self-pay | Admitting: Hematology & Oncology

## 2018-07-18 ENCOUNTER — Other Ambulatory Visit: Payer: Self-pay

## 2018-07-18 ENCOUNTER — Inpatient Hospital Stay: Payer: Medicare Other

## 2018-07-18 VITALS — BP 120/74 | HR 73 | Temp 97.6°F | Resp 16 | Wt 205.0 lb

## 2018-07-18 DIAGNOSIS — Z79899 Other long term (current) drug therapy: Secondary | ICD-10-CM | POA: Diagnosis not present

## 2018-07-18 DIAGNOSIS — Z8572 Personal history of non-Hodgkin lymphomas: Secondary | ICD-10-CM | POA: Diagnosis present

## 2018-07-18 DIAGNOSIS — C8205 Follicular lymphoma grade I, lymph nodes of inguinal region and lower limb: Secondary | ICD-10-CM

## 2018-07-18 DIAGNOSIS — E559 Vitamin D deficiency, unspecified: Secondary | ICD-10-CM

## 2018-07-18 LAB — CBC WITH DIFFERENTIAL (CANCER CENTER ONLY)
Abs Immature Granulocytes: 0.01 10*3/uL (ref 0.00–0.07)
Basophils Absolute: 0.1 10*3/uL (ref 0.0–0.1)
Basophils Relative: 1 %
Eosinophils Absolute: 0.1 10*3/uL (ref 0.0–0.5)
Eosinophils Relative: 2 %
HCT: 43.7 % (ref 39.0–52.0)
Hemoglobin: 15.2 g/dL (ref 13.0–17.0)
IMMATURE GRANULOCYTES: 0 %
LYMPHS ABS: 2.4 10*3/uL (ref 0.7–4.0)
LYMPHS PCT: 48 %
MCH: 34 pg (ref 26.0–34.0)
MCHC: 34.8 g/dL (ref 30.0–36.0)
MCV: 97.8 fL (ref 80.0–100.0)
Monocytes Absolute: 0.6 10*3/uL (ref 0.1–1.0)
Monocytes Relative: 11 %
Neutro Abs: 1.9 10*3/uL (ref 1.7–7.7)
Neutrophils Relative %: 38 %
Platelet Count: 175 10*3/uL (ref 150–400)
RBC: 4.47 MIL/uL (ref 4.22–5.81)
RDW: 12.5 % (ref 11.5–15.5)
WBC Count: 5.1 10*3/uL (ref 4.0–10.5)
nRBC: 0 % (ref 0.0–0.2)

## 2018-07-18 LAB — CMP (CANCER CENTER ONLY)
ALK PHOS: 73 U/L (ref 38–126)
ALT: 19 U/L (ref 0–44)
AST: 22 U/L (ref 15–41)
Albumin: 4.6 g/dL (ref 3.5–5.0)
Anion gap: 8 (ref 5–15)
BUN: 24 mg/dL — ABNORMAL HIGH (ref 8–23)
CALCIUM: 9.4 mg/dL (ref 8.9–10.3)
CO2: 31 mmol/L (ref 22–32)
Chloride: 99 mmol/L (ref 98–111)
Creatinine: 0.89 mg/dL (ref 0.61–1.24)
GFR, Est AFR Am: >60 mL/min (ref >60)
GFR, Estimated: >60 mL/min (ref >60)
Glucose, Bld: 135 mg/dL — ABNORMAL HIGH (ref 70–99)
Potassium: 4.7 mmol/L (ref 3.5–5.1)
Sodium: 138 mmol/L (ref 135–145)
Total Bilirubin: 0.8 mg/dL (ref 0.3–1.2)
Total Protein: 7.2 g/dL (ref 6.5–8.1)

## 2018-07-18 LAB — LACTATE DEHYDROGENASE: LDH: 200 U/L — ABNORMAL HIGH (ref 98–192)

## 2018-07-18 NOTE — Progress Notes (Signed)
Hematology and Oncology Follow Up Visit  Walter [name] [id number] March 06, 1946 73 y.o. 07/18/2018   Principle Diagnosis:   Diffuse large cell non-Hodgkin's lymphoma-clinical remission  Current Therapy:    Observation     Interim History:  [name] is back for a six-month followup.  He is doing okay.  He had a good summer.  He had been doing too much outside because of the heat.  His blood pressure is doing a whole lot better now.  He is on Cozaar.  His back is doing okay.  He has had no problems with bowels or bladder.  He is had no leg swelling.  There is been no rashes.  He has occasional dizziness but this might be more reflection of low hydration status.    Overall, his performance status is ECOG 0.  M dications:  Current Outpatient Medications:  .  acetaminophen (TYLENOL) 500 MG tablet, Take 1,000 mg by mouth at bedtime as needed., Disp: , Rfl:  .  Ascorbic Acid (VITAMIN C) 1000 MG tablet, Take 1,000 mg by mouth daily., Disp: , Rfl:  .  aspirin EC 81 MG tablet, Take 81 mg by mouth daily., Disp: , Rfl:  .  atorvastatin (LIPITOR) 20 MG tablet, Take 20 mg by mouth daily. , Disp: , Rfl:  .  Cholecalciferol (VITAMIN D) 2000 UNITS tablet, Take 2,000 Units by mouth daily., Disp: , Rfl:  .  docusate sodium (COLACE) 100 MG capsule, Take 100 mg by mouth daily., Disp: , Rfl:  .  lisinopril-hydrochlorothiazide (PRINZIDE,ZESTORETIC) 10-12.5 MG tablet, Take 1 tablet by mouth daily., Disp: , Rfl:  .  loratadine (CLARITIN) 10 MG tablet, Take 10 mg by mouth daily. , Disp: , Rfl:  .  Multiple Vitamins-Minerals (MULTIVITAMIN PO), Take by mouth every morning., Disp: , Rfl:  .  naproxen sodium (ANAPROX) 220 MG tablet, Take 220 mg by mouth every morning. , Disp: , Rfl:  .  traMADol (ULTRAM) 50 MG tablet, 2 tabs daily    100 mg. in AM, Disp: , Rfl:   Allergies:  Allergies  Allergen Reactions  . Penicillins Hives    itching  . Amoxicillin Hives    Past Medical History, Surgical  history, Social history, and Family History were reviewed and updated.  Review of Systems: As stated in the interim history  Physical Exam:  weight is 205 lb (93 kg). His oral temperature is 97.6 F (36.4 C). His blood pressure is 120/74 and his pulse is 73. His respiration is 16 and oxygen saturation is 99%.  Physical Exam Vitals signs reviewed.  HENT:     Head: Normocephalic and atraumatic.  Eyes:     Pupils: Pupils are equal, round, and reactive to light.  Neck:     Musculoskeletal: Normal range of motion.  Cardiovascular:     Rate and Rhythm: Normal rate and regular rhythm.     Heart sounds: Normal heart sounds.  Pulmonary:     Effort: Pulmonary effort is normal.     Breath sounds: Normal breath sounds.  Abdominal:     General: Bowel sounds are normal.     Palpations: Abdomen is soft.  Musculoskeletal: Normal range of motion.        General: No tenderness or deformity.  Lymphadenopathy:     Cervical: No cervical adenopathy.  Skin:    General: Skin is warm and dry.     Findings: No erythema or rash.  Neurological:     Mental Status: He is alert and  oriented to person, place, and time.  Psychiatric:        Behavior: Behavior normal.        Thought Content: Thought content normal.        Judgment: Judgment normal.      Lab Results  Component Value Date   WBC 5.1 07/18/2018   HGB 15.2 07/18/2018   HCT 43.7 07/18/2018   MCV 97.8 07/18/2018   PLT 175 07/18/2018     Chemistry      Component Value Date/Time   NA 138 07/18/2018 0832   NA 140 01/11/2017 1033   NA 140 07/07/2016 1050   K 4.7 07/18/2018 0832   K 4.1 01/11/2017 1033   K 4.4 07/07/2016 1050   CL 99 07/18/2018 0832   CL 101 01/11/2017 1033   CO2 31 07/18/2018 0832   CO2 31 01/11/2017 1033   CO2 27 07/07/2016 1050   BUN 24 (H) 07/18/2018 0832   BUN 22 01/11/2017 1033   BUN 13.8 07/07/2016 1050   CREATININE 0.89 07/18/2018 0832   CREATININE 0.7 01/11/2017 1033   CREATININE 0.7 07/07/2016 1050       Component Value Date/Time   CALCIUM 9.4 07/18/2018 0832   CALCIUM 9.0 01/11/2017 1033   CALCIUM 9.7 07/07/2016 1050   ALKPHOS 73 07/18/2018 0832   ALKPHOS 103 (H) 01/11/2017 1033   ALKPHOS 119 07/07/2016 1050   AST 22 07/18/2018 0832   AST 17 07/07/2016 1050   ALT 19 07/18/2018 0832   ALT 22 01/11/2017 1033   ALT 18 07/07/2016 1050   BILITOT 0.8 07/18/2018 0832   BILITOT 0.68 07/07/2016 1050         Impression and Plan: Walter [name] is 73 year old gentleman with a history of a diffuse large cell non-Hodgkin's lymphoma. He's done very nicely. He now is out from his treatments by 7 1/2 years. He completed all his treatments back in March of 2012.  He is doing pretty well from my point of view.  I do not need any x-rays.  He does not need any additional studies.  We will plan to get him back in another 6 months.   [name], MD 3/17/20209:46 AM

## 2018-07-19 LAB — VITAMIN D 25 HYDROXY (VIT D DEFICIENCY, FRACTURES): Vit D, 25-Hydroxy: 52 ng/mL (ref 30.0–100.0)

## 2019-01-16 ENCOUNTER — Other Ambulatory Visit: Payer: Self-pay

## 2019-01-16 ENCOUNTER — Inpatient Hospital Stay: Payer: Medicare Other | Attending: Hematology & Oncology | Admitting: Hematology & Oncology

## 2019-01-16 ENCOUNTER — Inpatient Hospital Stay: Payer: Medicare Other

## 2019-01-16 ENCOUNTER — Encounter: Payer: Self-pay | Admitting: Hematology & Oncology

## 2019-01-16 VITALS — BP 104/86 | HR 77 | Temp 97.1°F | Resp 20 | Wt 199.0 lb

## 2019-01-16 DIAGNOSIS — R42 Dizziness and giddiness: Secondary | ICD-10-CM | POA: Diagnosis not present

## 2019-01-16 DIAGNOSIS — Z79899 Other long term (current) drug therapy: Secondary | ICD-10-CM | POA: Insufficient documentation

## 2019-01-16 DIAGNOSIS — C8338 Diffuse large B-cell lymphoma, lymph nodes of multiple sites: Secondary | ICD-10-CM | POA: Diagnosis not present

## 2019-01-16 DIAGNOSIS — Z8572 Personal history of non-Hodgkin lymphomas: Secondary | ICD-10-CM | POA: Diagnosis not present

## 2019-01-16 DIAGNOSIS — C8205 Follicular lymphoma grade I, lymph nodes of inguinal region and lower limb: Secondary | ICD-10-CM

## 2019-01-16 LAB — CBC WITH DIFFERENTIAL (CANCER CENTER ONLY)
Abs Immature Granulocytes: 0.01 10*3/uL (ref 0.00–0.07)
Basophils Absolute: 0.1 10*3/uL (ref 0.0–0.1)
Basophils Relative: 1 %
Eosinophils Absolute: 0.1 10*3/uL (ref 0.0–0.5)
Eosinophils Relative: 2 %
HCT: 47 % (ref 39.0–52.0)
Hemoglobin: 15.9 g/dL (ref 13.0–17.0)
Immature Granulocytes: 0 %
Lymphocytes Relative: 36 %
Lymphs Abs: 2.2 10*3/uL (ref 0.7–4.0)
MCH: 33.1 pg (ref 26.0–34.0)
MCHC: 33.8 g/dL (ref 30.0–36.0)
MCV: 97.9 fL (ref 80.0–100.0)
Monocytes Absolute: 0.5 10*3/uL (ref 0.1–1.0)
Monocytes Relative: 7 %
Neutro Abs: 3.3 10*3/uL (ref 1.7–7.7)
Neutrophils Relative %: 54 %
Platelet Count: 177 10*3/uL (ref 150–400)
RBC: 4.8 MIL/uL (ref 4.22–5.81)
RDW: 12.7 % (ref 11.5–15.5)
WBC Count: 6.1 10*3/uL (ref 4.0–10.5)
nRBC: 0 % (ref 0.0–0.2)

## 2019-01-16 LAB — CMP (CANCER CENTER ONLY)
ALT: 20 U/L (ref 0–44)
AST: 18 U/L (ref 15–41)
Albumin: 4.5 g/dL (ref 3.5–5.0)
Alkaline Phosphatase: 68 U/L (ref 38–126)
Anion gap: 8 (ref 5–15)
BUN: 18 mg/dL (ref 8–23)
CO2: 30 mmol/L (ref 22–32)
Calcium: 9.6 mg/dL (ref 8.9–10.3)
Chloride: 101 mmol/L (ref 98–111)
Creatinine: 0.79 mg/dL (ref 0.61–1.24)
GFR, Est AFR Am: >60 mL/min (ref >60)
GFR, Estimated: >60 mL/min (ref >60)
Glucose, Bld: 112 mg/dL — ABNORMAL HIGH (ref 70–99)
Potassium: 4.7 mmol/L (ref 3.5–5.1)
Sodium: 139 mmol/L (ref 135–145)
Total Bilirubin: 0.7 mg/dL (ref 0.3–1.2)
Total Protein: 7.2 g/dL (ref 6.5–8.1)

## 2019-01-16 LAB — LACTATE DEHYDROGENASE: LDH: 145 U/L (ref 98–192)

## 2019-01-16 NOTE — Progress Notes (Signed)
Hematology and Oncology Follow Up Visit  Walter [name] [id number] [birth year] 74 y.o. 01/16/2019   Principle Diagnosis:   Diffuse large cell non-Hodgkin's lymphoma-clinical remission  Current Therapy:    Observation     Interim History:  [name] is back for a six-month followup.  He is doing okay.  He had a good summer.  He had been doing too much outside because of the heat.  His blood pressure is doing a whole lot better now.  He is on Cozaar.  His back is doing okay.  He has had no problems with bowels or bladder.  He is had no leg swelling.  There is been no rashes.  He has occasional dizziness but this might be more reflection of low hydration status.    Overall, his performance status is ECOG 0.  M dications:  Current Outpatient Medications:  .  acetaminophen (TYLENOL) 500 MG tablet, Take 1,000 mg by mouth at bedtime as needed., Disp: , Rfl:  .  Ascorbic Acid (VITAMIN C) 1000 MG tablet, Take 1,000 mg by mouth daily., Disp: , Rfl:  .  aspirin EC 81 MG tablet, Take 81 mg by mouth daily., Disp: , Rfl:  .  atorvastatin (LIPITOR) 20 MG tablet, Take 20 mg by mouth daily. , Disp: , Rfl:  .  Cholecalciferol (VITAMIN D) 2000 UNITS tablet, Take 2,000 Units by mouth daily., Disp: , Rfl:  .  docusate sodium (COLACE) 100 MG capsule, Take 100 mg by mouth daily., Disp: , Rfl:  .  lisinopril-hydrochlorothiazide (PRINZIDE,ZESTORETIC) 10-12.5 MG tablet, Take 1 tablet by mouth daily., Disp: , Rfl:  .  loratadine (CLARITIN) 10 MG tablet, Take 10 mg by mouth daily. , Disp: , Rfl:  .  Multiple Vitamins-Minerals (MULTIVITAMIN PO), Take by mouth every morning., Disp: , Rfl:  .  naproxen sodium (ANAPROX) 220 MG tablet, Take 220 mg by mouth every morning. , Disp: , Rfl:  .  traMADol (ULTRAM) 50 MG tablet, 2 tabs daily    100 mg. in AM, Disp: , Rfl:   Allergies:  Allergies  Allergen Reactions  . Penicillins Hives    itching  . Amoxicillin Hives    Past Medical History, Surgical  history, Social history, and Family History were reviewed and updated.  Review of Systems: As stated in the interim history  Physical Exam:  weight is 199 lb (90.3 kg). His oral temperature is 97.1 F (36.2 C) (abnormal). His blood pressure is 104/86 and his pulse is 77. His respiration is 20 and oxygen saturation is 100%.  Physical Exam Vitals signs reviewed.  HENT:     Head: Normocephalic and atraumatic.  Eyes:     Pupils: Pupils are equal, round, and reactive to light.  Neck:     Musculoskeletal: Normal range of motion.  Cardiovascular:     Rate and Rhythm: Normal rate and regular rhythm.     Heart sounds: Normal heart sounds.  Pulmonary:     Effort: Pulmonary effort is normal.     Breath sounds: Normal breath sounds.  Abdominal:     General: Bowel sounds are normal.     Palpations: Abdomen is soft.  Musculoskeletal: Normal range of motion.        General: No tenderness or deformity.  Lymphadenopathy:     Cervical: No cervical adenopathy.  Skin:    General: Skin is warm and dry.     Findings: No erythema or rash.  Neurological:     Mental Status: He is alert  and oriented to person, place, and time.  Psychiatric:        Behavior: Behavior normal.        Thought Content: Thought content normal.        Judgment: Judgment normal.      Lab Results  Component Value Date   WBC 6.1 01/16/2019   HGB 15.9 01/16/2019   HCT 47.0 01/16/2019   MCV 97.9 01/16/2019   PLT 177 01/16/2019     Chemistry      Component Value Date/Time   NA 139 01/16/2019 0925   NA 140 01/11/2017 1033   NA 140 07/07/2016 1050   K 4.7 01/16/2019 0925   K 4.1 01/11/2017 1033   K 4.4 07/07/2016 1050   CL 101 01/16/2019 0925   CL 101 01/11/2017 1033   CO2 30 01/16/2019 0925   CO2 31 01/11/2017 1033   CO2 27 07/07/2016 1050   BUN 18 01/16/2019 0925   BUN 22 01/11/2017 1033   BUN 13.8 07/07/2016 1050   CREATININE 0.79 01/16/2019 0925   CREATININE 0.7 01/11/2017 1033   CREATININE 0.7  07/07/2016 1050      Component Value Date/Time   CALCIUM 9.6 01/16/2019 0925   CALCIUM 9.0 01/11/2017 1033   CALCIUM 9.7 07/07/2016 1050   ALKPHOS 68 01/16/2019 0925   ALKPHOS 103 (H) 01/11/2017 1033   ALKPHOS 119 07/07/2016 1050   AST 18 01/16/2019 0925   AST 17 07/07/2016 1050   ALT 20 01/16/2019 0925   ALT 22 01/11/2017 1033   ALT 18 07/07/2016 1050   BILITOT 0.7 01/16/2019 0925   BILITOT 0.68 07/07/2016 1050         Impression and Plan: Mr. Walter [name] is 73 year old gentleman with a history of a diffuse large cell non-Hodgkin's lymphoma. He's done very nicely. He now is out from his treatments by 8 years. He completed all his treatments back in March of 2012.  He is doing pretty well from my point of view.  I do not need any x-rays.  He does not need any additional studies.  We will plan to get him back in another 6 months.   [name], MD 9/15/202010:15 AM

## 2019-01-17 LAB — HEMOGLOBIN A1C
Hgb A1c MFr Bld: 6.1 % — ABNORMAL HIGH (ref 4.8–5.6)
Mean Plasma Glucose: 128 mg/dL

## 2019-07-17 ENCOUNTER — Other Ambulatory Visit: Payer: Self-pay

## 2019-07-17 ENCOUNTER — Encounter: Payer: Self-pay | Admitting: Hematology & Oncology

## 2019-07-17 ENCOUNTER — Inpatient Hospital Stay: Payer: Medicare Other

## 2019-07-17 ENCOUNTER — Inpatient Hospital Stay: Payer: Medicare Other | Attending: Hematology & Oncology | Admitting: Hematology & Oncology

## 2019-07-17 VITALS — BP 106/73 | HR 77 | Temp 95.9°F | Resp 18 | Wt 199.0 lb

## 2019-07-17 DIAGNOSIS — E119 Type 2 diabetes mellitus without complications: Secondary | ICD-10-CM | POA: Diagnosis not present

## 2019-07-17 DIAGNOSIS — C8338 Diffuse large B-cell lymphoma, lymph nodes of multiple sites: Secondary | ICD-10-CM

## 2019-07-17 DIAGNOSIS — Z8572 Personal history of non-Hodgkin lymphomas: Secondary | ICD-10-CM | POA: Insufficient documentation

## 2019-07-17 DIAGNOSIS — C8332 Diffuse large B-cell lymphoma, intrathoracic lymph nodes: Secondary | ICD-10-CM | POA: Diagnosis not present

## 2019-07-17 LAB — CMP (CANCER CENTER ONLY)
ALT: 17 U/L (ref 0–44)
AST: 17 U/L (ref 15–41)
Albumin: 4.5 g/dL (ref 3.5–5.0)
Alkaline Phosphatase: 73 U/L (ref 38–126)
Anion gap: 7 (ref 5–15)
BUN: 20 mg/dL (ref 8–23)
CO2: 30 mmol/L (ref 22–32)
Calcium: 9.7 mg/dL (ref 8.9–10.3)
Chloride: 100 mmol/L (ref 98–111)
Creatinine: 0.83 mg/dL (ref 0.61–1.24)
GFR, Est AFR Am: >60 mL/min (ref >60)
GFR, Estimated: >60 mL/min (ref >60)
Glucose, Bld: 120 mg/dL — ABNORMAL HIGH (ref 70–99)
Potassium: 4.4 mmol/L (ref 3.5–5.1)
Sodium: 137 mmol/L (ref 135–145)
Total Bilirubin: 0.6 mg/dL (ref 0.3–1.2)
Total Protein: 7.2 g/dL (ref 6.5–8.1)

## 2019-07-17 LAB — CBC WITH DIFFERENTIAL (CANCER CENTER ONLY)
Abs Immature Granulocytes: 0.04 10*3/uL (ref 0.00–0.07)
Basophils Absolute: 0.1 10*3/uL (ref 0.0–0.1)
Basophils Relative: 1 %
Eosinophils Absolute: 0.1 10*3/uL (ref 0.0–0.5)
Eosinophils Relative: 2 %
HCT: 45.8 % (ref 39.0–52.0)
Hemoglobin: 15.8 g/dL (ref 13.0–17.0)
Immature Granulocytes: 1 %
Lymphocytes Relative: 40 %
Lymphs Abs: 2.3 10*3/uL (ref 0.7–4.0)
MCH: 33.3 pg (ref 26.0–34.0)
MCHC: 34.5 g/dL (ref 30.0–36.0)
MCV: 96.6 fL (ref 80.0–100.0)
Monocytes Absolute: 0.4 10*3/uL (ref 0.1–1.0)
Monocytes Relative: 8 %
Neutro Abs: 2.8 10*3/uL (ref 1.7–7.7)
Neutrophils Relative %: 48 %
Platelet Count: 179 10*3/uL (ref 150–400)
RBC: 4.74 MIL/uL (ref 4.22–5.81)
RDW: 12.2 % (ref 11.5–15.5)
WBC Count: 5.7 10*3/uL (ref 4.0–10.5)
nRBC: 0 % (ref 0.0–0.2)

## 2019-07-17 NOTE — Progress Notes (Signed)
Hematology and Oncology Follow Up Visit  Walter [name] [id number] 24-May-1945 74 y.o. 07/17/2019   Principle Diagnosis:   Diffuse large cell non-Hodgkin's lymphoma-clinical remission  Current Therapy:    Observation     Interim History:  [name] is back for a six-month followup.  He is doing okay.  He and his wife are being very cautious with the coronavirus.  They still not certain about getting the vaccine.  He has had no problem with fever.  He has had no issues with nausea or vomiting.  He has had no cough or shortness of breath.  His blood sugars are being monitored by his family doctor.  His blood sugar today is 120.  This really is not all that bad.  He has had no change in bowel or bladder habits.  He has had no leg swelling.  He has had no rashes.  Overall, his performance status is ECOG 0.  M dications:  Current Outpatient Medications:  .  acetaminophen (TYLENOL) 500 MG tablet, Take 1,000 mg by mouth at bedtime as needed., Disp: , Rfl:  .  Ascorbic Acid (VITAMIN C) 1000 MG tablet, Take 1,000 mg by mouth daily., Disp: , Rfl:  .  aspirin EC 81 MG tablet, Take 81 mg by mouth daily., Disp: , Rfl:  .  atorvastatin (LIPITOR) 20 MG tablet, Take 20 mg by mouth daily. , Disp: , Rfl:  .  Cholecalciferol (VITAMIN D) 2000 UNITS tablet, Take 2,000 Units by mouth daily., Disp: , Rfl:  .  docusate sodium (COLACE) 100 MG capsule, Take 100 mg by mouth daily., Disp: , Rfl:  .  lisinopril-hydrochlorothiazide (PRINZIDE,ZESTORETIC) 10-12.5 MG tablet, Take 1 tablet by mouth daily., Disp: , Rfl:  .  loratadine (CLARITIN) 10 MG tablet, Take 10 mg by mouth daily. , Disp: , Rfl:  .  Multiple Vitamins-Minerals (MULTIVITAMIN PO), Take by mouth every morning., Disp: , Rfl:  .  naproxen sodium (ANAPROX) 220 MG tablet, Take 220 mg by mouth every morning. , Disp: , Rfl:  .  traMADol (ULTRAM) 50 MG tablet, 2 tabs daily    100 mg. in AM, Disp: , Rfl:   Allergies:  Allergies  Allergen  Reactions  . Penicillins Hives    itching  . Amoxicillin Hives    Past Medical History, Surgical history, Social history, and Family History were reviewed and updated.  Review of Systems: As stated in the interim history  Physical Exam:  vitals were not taken for this visit.  Physical Exam Vitals reviewed.  HENT:     Head: Normocephalic and atraumatic.  Eyes:     Pupils: Pupils are equal, round, and reactive to light.  Cardiovascular:     Rate and Rhythm: Normal rate and regular rhythm.     Heart sounds: Normal heart sounds.  Pulmonary:     Effort: Pulmonary effort is normal.     Breath sounds: Normal breath sounds.  Abdominal:     General: Bowel sounds are normal.     Palpations: Abdomen is soft.  Musculoskeletal:        General: No tenderness or deformity. Normal range of motion.     Cervical back: Normal range of motion.  Lymphadenopathy:     Cervical: No cervical adenopathy.  Skin:    General: Skin is warm and dry.     Findings: No erythema or rash.  Neurological:     Mental Status: He is alert and oriented to person, place, and time.  Psychiatric:  Behavior: Behavior normal.        Thought Content: Thought content normal.        Judgment: Judgment normal.      Lab Results  Component Value Date   WBC 5.7 07/17/2019   HGB 15.8 07/17/2019   HCT 45.8 07/17/2019   MCV 96.6 07/17/2019   PLT 179 07/17/2019     Chemistry      Component Value Date/Time   NA 137 07/17/2019 0853   NA 140 01/11/2017 1033   NA 140 07/07/2016 1050   K 4.4 07/17/2019 0853   K 4.1 01/11/2017 1033   K 4.4 07/07/2016 1050   CL 100 07/17/2019 0853   CL 101 01/11/2017 1033   CO2 30 07/17/2019 0853   CO2 31 01/11/2017 1033   CO2 27 07/07/2016 1050   BUN 20 07/17/2019 0853   BUN 22 01/11/2017 1033   BUN 13.8 07/07/2016 1050   CREATININE 0.83 07/17/2019 0853   CREATININE 0.7 01/11/2017 1033   CREATININE 0.7 07/07/2016 1050      Component Value Date/Time   CALCIUM 9.7  07/17/2019 0853   CALCIUM 9.0 01/11/2017 1033   CALCIUM 9.7 07/07/2016 1050   ALKPHOS 73 07/17/2019 0853   ALKPHOS 103 (H) 01/11/2017 1033   ALKPHOS 119 07/07/2016 1050   AST 17 07/17/2019 0853   AST 17 07/07/2016 1050   ALT 17 07/17/2019 0853   ALT 22 01/11/2017 1033   ALT 18 07/07/2016 1050   BILITOT 0.6 07/17/2019 0853   BILITOT 0.68 07/07/2016 1050         Impression and Plan: Walter [name] is 74 year old gentleman with a history of a diffuse large cell non-Hodgkin's lymphoma. He's done very nicely. He now is out from his treatments by 8 years. He completed all his treatments back in March of 2012.  He is doing pretty well from my point of view.  I do not need any x-rays.  He does not need any additional studies.  We will plan to get him back in another 6 months.  Clearly, his prognosis will be dictated by his diabetes.  [name], MD 3/16/20219:53 AM

## 2020-01-17 ENCOUNTER — Other Ambulatory Visit: Payer: Self-pay

## 2020-01-17 ENCOUNTER — Inpatient Hospital Stay (HOSPITAL_BASED_OUTPATIENT_CLINIC_OR_DEPARTMENT_OTHER): Payer: Medicare Other | Admitting: Hematology & Oncology

## 2020-01-17 ENCOUNTER — Inpatient Hospital Stay: Payer: Medicare Other | Attending: Hematology & Oncology

## 2020-01-17 ENCOUNTER — Encounter: Payer: Self-pay | Admitting: Hematology & Oncology

## 2020-01-17 VITALS — BP 110/68 | HR 71 | Temp 97.7°F | Resp 18 | Ht 70.0 in | Wt 196.8 lb

## 2020-01-17 DIAGNOSIS — Z79899 Other long term (current) drug therapy: Secondary | ICD-10-CM | POA: Diagnosis not present

## 2020-01-17 DIAGNOSIS — C8338 Diffuse large B-cell lymphoma, lymph nodes of multiple sites: Secondary | ICD-10-CM | POA: Diagnosis not present

## 2020-01-17 DIAGNOSIS — Z8572 Personal history of non-Hodgkin lymphomas: Secondary | ICD-10-CM | POA: Insufficient documentation

## 2020-01-17 DIAGNOSIS — C8332 Diffuse large B-cell lymphoma, intrathoracic lymph nodes: Secondary | ICD-10-CM

## 2020-01-17 LAB — CBC WITH DIFFERENTIAL (CANCER CENTER ONLY)
Abs Immature Granulocytes: 0.01 10*3/uL (ref 0.00–0.07)
Basophils Absolute: 0.1 10*3/uL (ref 0.0–0.1)
Basophils Relative: 1 %
Eosinophils Absolute: 0.1 10*3/uL (ref 0.0–0.5)
Eosinophils Relative: 2 %
HCT: 44.7 % (ref 39.0–52.0)
Hemoglobin: 15.6 g/dL (ref 13.0–17.0)
Immature Granulocytes: 0 %
Lymphocytes Relative: 43 %
Lymphs Abs: 2.4 10*3/uL (ref 0.7–4.0)
MCH: 33.7 pg (ref 26.0–34.0)
MCHC: 34.9 g/dL (ref 30.0–36.0)
MCV: 96.5 fL (ref 80.0–100.0)
Monocytes Absolute: 0.5 10*3/uL (ref 0.1–1.0)
Monocytes Relative: 8 %
Neutro Abs: 2.6 10*3/uL (ref 1.7–7.7)
Neutrophils Relative %: 46 %
Platelet Count: 155 10*3/uL (ref 150–400)
RBC: 4.63 MIL/uL (ref 4.22–5.81)
RDW: 12.4 % (ref 11.5–15.5)
WBC Count: 5.7 10*3/uL (ref 4.0–10.5)
nRBC: 0 % (ref 0.0–0.2)

## 2020-01-17 LAB — CMP (CANCER CENTER ONLY)
ALT: 17 U/L (ref 0–44)
AST: 16 U/L (ref 15–41)
Albumin: 4.1 g/dL (ref 3.5–5.0)
Alkaline Phosphatase: 85 U/L (ref 38–126)
Anion gap: 7 (ref 5–15)
BUN: 17 mg/dL (ref 8–23)
CO2: 32 mmol/L (ref 22–32)
Calcium: 9.9 mg/dL (ref 8.9–10.3)
Chloride: 99 mmol/L (ref 98–111)
Creatinine: 0.81 mg/dL (ref 0.61–1.24)
GFR, Est AFR Am: >60 mL/min (ref >60)
GFR, Estimated: >60 mL/min (ref >60)
Glucose, Bld: 111 mg/dL — ABNORMAL HIGH (ref 70–99)
Potassium: 4.3 mmol/L (ref 3.5–5.1)
Sodium: 138 mmol/L (ref 135–145)
Total Bilirubin: 0.5 mg/dL (ref 0.3–1.2)
Total Protein: 6.9 g/dL (ref 6.5–8.1)

## 2020-01-17 LAB — LACTATE DEHYDROGENASE: LDH: 130 U/L (ref 98–192)

## 2020-01-17 NOTE — Progress Notes (Signed)
Hematology and Oncology Follow Up Visit  Walter [name] [id number] 1946/02/17 74 y.o. 01/17/2020   Principle Diagnosis:   Diffuse large cell non-Hodgkin's lymphoma-clinical remission  Current Therapy:    Observation     Interim History:  [name] is back for a six-month followup.  He really looks good.  He is trying to stay active.  His blood sugars are doing better.  His hemoglobin A1c was 6.2 a week ago.  He is busy around the house.  He likes to do a lot of yard work.  He is wife did have a nice vacation.  They went to the beach.  He has had no problems with back discomfort.  He is being very careful with his back.  He has had no issues with nausea or vomiting.  He has had no problems with fever.  There has been no issues with bleeding.  He has had no change in bowel or bladder habits.  Overall, his performance status is ECOG 0.    Medications:  Current Outpatient Medications:  .  acetaminophen (TYLENOL) 500 MG tablet, Take 1,000 mg by mouth at bedtime as needed., Disp: , Rfl:  .  Ascorbic Acid (VITAMIN C) 1000 MG tablet, Take 1,000 mg by mouth daily., Disp: , Rfl:  .  aspirin EC 81 MG tablet, Take 81 mg by mouth daily., Disp: , Rfl:  .  atorvastatin (LIPITOR) 20 MG tablet, Take 20 mg by mouth daily. , Disp: , Rfl:  .  Cholecalciferol (VITAMIN D) 2000 UNITS tablet, Take 2,000 Units by mouth daily., Disp: , Rfl:  .  docusate sodium (COLACE) 100 MG capsule, Take 100 mg by mouth daily., Disp: , Rfl:  .  lisinopril-hydrochlorothiazide (PRINZIDE,ZESTORETIC) 10-12.5 MG tablet, Take 1 tablet by mouth daily., Disp: , Rfl:  .  loratadine (CLARITIN) 10 MG tablet, Take 10 mg by mouth daily. , Disp: , Rfl:  .  Multiple Vitamins-Minerals (MULTIVITAMIN PO), Take by mouth every morning., Disp: , Rfl:  .  naproxen sodium (ANAPROX) 220 MG tablet, Take 220 mg by mouth every morning. , Disp: , Rfl:  .  traMADol (ULTRAM) 50 MG tablet, 2 tabs daily    100 mg. in AM, Disp: , Rfl:    Allergies:  Allergies  Allergen Reactions  . Penicillins Hives and Itching  . Amoxicillin Hives    Past Medical History, Surgical history, Social history, and Family History were reviewed and updated.  Review of Systems: Review of Systems  Constitutional: Negative.   HENT: Negative.   Eyes: Negative.   Respiratory: Negative.   Cardiovascular: Negative.   Gastrointestinal: Negative.   Genitourinary: Negative.   Musculoskeletal: Negative.   Skin: Negative.   Neurological: Negative.   Endo/Heme/Allergies: Negative.   Psychiatric/Behavioral: Negative.      Physical Exam:  height is 5\' 10"  (1.778 m) and weight is 196 lb 12.8 oz (89.3 kg). His oral temperature is 97.7 F (36.5 C). His blood pressure is 110/68 and his pulse is 71. His respiration is 18 and oxygen saturation is 100%.  Physical Exam Vitals reviewed.  HENT:     Head: Normocephalic and atraumatic.  Eyes:     Pupils: Pupils are equal, round, and reactive to light.  Cardiovascular:     Rate and Rhythm: Normal rate and regular rhythm.     Heart sounds: Normal heart sounds.  Pulmonary:     Effort: Pulmonary effort is normal.     Breath sounds: Normal breath sounds.  Abdominal:  General: Bowel sounds are normal.     Palpations: Abdomen is soft.  Musculoskeletal:        General: No tenderness or deformity. Normal range of motion.     Cervical back: Normal range of motion.  Lymphadenopathy:     Cervical: No cervical adenopathy.  Skin:    General: Skin is warm and dry.     Findings: No erythema or rash.  Neurological:     Mental Status: He is alert and oriented to person, place, and time.  Psychiatric:        Behavior: Behavior normal.        Thought Content: Thought content normal.        Judgment: Judgment normal.      Lab Results  Component Value Date   WBC 5.7 01/17/2020   HGB 15.6 01/17/2020   HCT 44.7 01/17/2020   MCV 96.5 01/17/2020   PLT 155 01/17/2020     Chemistry      Component  Value Date/Time   NA 138 01/17/2020 0822   NA 140 01/11/2017 1033   NA 140 07/07/2016 1050   K 4.3 01/17/2020 0822   K 4.1 01/11/2017 1033   K 4.4 07/07/2016 1050   CL 99 01/17/2020 0822   CL 101 01/11/2017 1033   CO2 32 01/17/2020 0822   CO2 31 01/11/2017 1033   CO2 27 07/07/2016 1050   BUN 17 01/17/2020 0822   BUN 22 01/11/2017 1033   BUN 13.8 07/07/2016 1050   CREATININE 0.81 01/17/2020 0822   CREATININE 0.7 01/11/2017 1033   CREATININE 0.7 07/07/2016 1050      Component Value Date/Time   CALCIUM 9.9 01/17/2020 0822   CALCIUM 9.0 01/11/2017 1033   CALCIUM 9.7 07/07/2016 1050   ALKPHOS 85 01/17/2020 0822   ALKPHOS 103 (H) 01/11/2017 1033   ALKPHOS 119 07/07/2016 1050   AST 16 01/17/2020 0822   AST 17 07/07/2016 1050   ALT 17 01/17/2020 0822   ALT 22 01/11/2017 1033   ALT 18 07/07/2016 1050   BILITOT 0.5 01/17/2020 0822   BILITOT 0.68 07/07/2016 1050      Impression and Plan: Walter [name] is 74 year old gentleman with a history of a diffuse large cell non-Hodgkin's lymphoma. He's done very nicely. He now is out from his treatments by 9 years. He completed all his treatments back in March of 2012.  He is doing pretty well from my point of view.  I do not need any x-rays.  He does not need any additional studies.  We will plan to get him back in another 6 months.   [name], MD 9/16/20219:10 AM

## 2020-07-17 ENCOUNTER — Other Ambulatory Visit: Payer: Medicare Other

## 2020-07-17 ENCOUNTER — Ambulatory Visit: Payer: Medicare Other | Admitting: Hematology & Oncology

## 2020-07-22 ENCOUNTER — Inpatient Hospital Stay (HOSPITAL_BASED_OUTPATIENT_CLINIC_OR_DEPARTMENT_OTHER): Payer: Medicare Other | Admitting: Hematology & Oncology

## 2020-07-22 ENCOUNTER — Inpatient Hospital Stay: Payer: Medicare Other | Attending: Hematology & Oncology

## 2020-07-22 ENCOUNTER — Encounter: Payer: Self-pay | Admitting: Hematology & Oncology

## 2020-07-22 ENCOUNTER — Other Ambulatory Visit: Payer: Self-pay

## 2020-07-22 VITALS — BP 116/77 | HR 82 | Temp 97.7°F | Resp 18 | Wt 205.2 lb

## 2020-07-22 DIAGNOSIS — G8929 Other chronic pain: Secondary | ICD-10-CM | POA: Insufficient documentation

## 2020-07-22 DIAGNOSIS — Z8572 Personal history of non-Hodgkin lymphomas: Secondary | ICD-10-CM | POA: Insufficient documentation

## 2020-07-22 DIAGNOSIS — E559 Vitamin D deficiency, unspecified: Secondary | ICD-10-CM | POA: Diagnosis not present

## 2020-07-22 DIAGNOSIS — M545 Low back pain, unspecified: Secondary | ICD-10-CM | POA: Insufficient documentation

## 2020-07-22 DIAGNOSIS — C8206 Follicular lymphoma grade I, intrapelvic lymph nodes: Secondary | ICD-10-CM | POA: Diagnosis not present

## 2020-07-22 DIAGNOSIS — C8338 Diffuse large B-cell lymphoma, lymph nodes of multiple sites: Secondary | ICD-10-CM

## 2020-07-22 LAB — CMP (CANCER CENTER ONLY)
ALT: 25 U/L (ref 0–44)
AST: 21 U/L (ref 15–41)
Albumin: 4.5 g/dL (ref 3.5–5.0)
Alkaline Phosphatase: 97 U/L (ref 38–126)
Anion gap: 7 (ref 5–15)
BUN: 17 mg/dL (ref 8–23)
CO2: 31 mmol/L (ref 22–32)
Calcium: 10 mg/dL (ref 8.9–10.3)
Chloride: 100 mmol/L (ref 98–111)
Creatinine: 0.78 mg/dL (ref 0.61–1.24)
GFR, Estimated: >60 mL/min (ref >60)
Glucose, Bld: 136 mg/dL — ABNORMAL HIGH (ref 70–99)
Potassium: 4.3 mmol/L (ref 3.5–5.1)
Sodium: 138 mmol/L (ref 135–145)
Total Bilirubin: 0.5 mg/dL (ref 0.3–1.2)
Total Protein: 7.6 g/dL (ref 6.5–8.1)

## 2020-07-22 LAB — CBC WITH DIFFERENTIAL (CANCER CENTER ONLY)
Abs Immature Granulocytes: 0.01 10*3/uL (ref 0.00–0.07)
Basophils Absolute: 0.1 10*3/uL (ref 0.0–0.1)
Basophils Relative: 1 %
Eosinophils Absolute: 0.1 10*3/uL (ref 0.0–0.5)
Eosinophils Relative: 2 %
HCT: 45.4 % (ref 39.0–52.0)
Hemoglobin: 15.8 g/dL (ref 13.0–17.0)
Immature Granulocytes: 0 %
Lymphocytes Relative: 41 %
Lymphs Abs: 2.6 10*3/uL (ref 0.7–4.0)
MCH: 33.3 pg (ref 26.0–34.0)
MCHC: 34.8 g/dL (ref 30.0–36.0)
MCV: 95.6 fL (ref 80.0–100.0)
Monocytes Absolute: 0.5 10*3/uL (ref 0.1–1.0)
Monocytes Relative: 8 %
Neutro Abs: 3.1 10*3/uL (ref 1.7–7.7)
Neutrophils Relative %: 48 %
Platelet Count: 176 10*3/uL (ref 150–400)
RBC: 4.75 MIL/uL (ref 4.22–5.81)
RDW: 12.5 % (ref 11.5–15.5)
WBC Count: 6.5 10*3/uL (ref 4.0–10.5)
nRBC: 0 % (ref 0.0–0.2)

## 2020-07-22 LAB — LACTATE DEHYDROGENASE: LDH: 127 U/L (ref 98–192)

## 2020-07-22 NOTE — Progress Notes (Signed)
Hematology and Oncology Follow Up Visit  Walter [name] [id number] July 11, 1945 75 y.o. 07/22/2020   Principle Diagnosis:   Diffuse large cell non-Hodgkin's lymphoma-clinical remission  Current Therapy:    Observation     Interim History:  [name] is back for a six-month followup.  There is no problems there has had no side was poor back.  He has had problems with his lower back.  Unfortunately, I will think he really notes his family doctor know about this.  He does have issues with his blood sugars.  I think his last hemoglobin A1c was 7.2.  He is gained weight.  He really wants to try to lose a little bit of weight.  Hopefully with the warmer weather, he will be able to get outside and do more activities.  He has had no issues with respect to the non-Hodgkin's lymphoma.  I really think that this is cured.  He has had no problems with bowels or bladder.  He has had no issues with Covid.  He has had no rashes.  He has had no leg swelling.  He has had no cough or shortness of breath.  He has had no bleeding.  Overall, his performance status is ECOG 1.    Medications:  Current Outpatient Medications:  .  acetaminophen (TYLENOL) 500 MG tablet, Take 1,000 mg by mouth at bedtime as needed., Disp: , Rfl:  .  Ascorbic Acid (VITAMIN C) 1000 MG tablet, Take 1,000 mg by mouth daily., Disp: , Rfl:  .  aspirin EC 81 MG tablet, Take 81 mg by mouth daily., Disp: , Rfl:  .  atorvastatin (LIPITOR) 20 MG tablet, Take 20 mg by mouth daily. , Disp: , Rfl:  .  Cholecalciferol (VITAMIN D) 2000 UNITS tablet, Take 2,000 Units by mouth daily., Disp: , Rfl:  .  diclofenac (VOLTAREN) 50 MG EC tablet, TAKE ONE TABLET BY MOUTH  PRN, Disp: , Rfl:  .  docusate sodium (COLACE) 100 MG capsule, Take 100 mg by mouth daily., Disp: , Rfl:  .  lisinopril-hydrochlorothiazide (PRINZIDE,ZESTORETIC) 10-12.5 MG tablet, Take 1 tablet by mouth daily., Disp: , Rfl:  .  loratadine (CLARITIN) 10 MG tablet, Take 10 mg  by mouth daily. , Disp: , Rfl:  .  Multiple Vitamins-Minerals (MULTIVITAMIN PO), Take by mouth every morning., Disp: , Rfl:  .  traMADol (ULTRAM) 50 MG tablet, 2 tabs daily    100 mg. in AM, Disp: , Rfl:  .  zinc gluconate 50 MG tablet, Take by mouth., Disp: , Rfl:  .  Cysteamine Bitartrate (PROCYSBI) 300 MG PACK, Use 1 each once daily To check blood sugar, diagnosis code: E11.69 (Patient not taking: Reported on 07/22/2020), Disp: , Rfl:  .  glucose blood (PRECISION QID TEST) test strip, 1 each (1 strip total) once daily To check blood sugar, diagnosis code:E11.69 (Patient not taking: Reported on 07/22/2020), Disp: , Rfl:  .  metFORMIN (GLUCOPHAGE) 500 MG tablet, Take 1 tablet by mouth daily with breakfast. (Patient not taking: Reported on 07/22/2020), Disp: , Rfl:  .  naproxen sodium (ANAPROX) 220 MG tablet, Take 220 mg by mouth every morning. , Disp: , Rfl:   Allergies:  Allergies  Allergen Reactions  . Bee Venom Anaphylaxis and Other (See Comments)  . Penicillins Hives and Itching  . Amoxicillin Hives    Past Medical History, Surgical history, Social history, and Family History were reviewed and updated.  Review of Systems: Review of Systems  Constitutional: Negative.  HENT: Negative.   Eyes: Negative.   Respiratory: Negative.   Cardiovascular: Negative.   Gastrointestinal: Negative.   Genitourinary: Negative.   Musculoskeletal: Negative.   Skin: Negative.   Neurological: Negative.   Endo/Heme/Allergies: Negative.   Psychiatric/Behavioral: Negative.      Physical Exam:  weight is 205 lb 4 oz (93.1 kg). His oral temperature is 97.7 F (36.5 C). His blood pressure is 116/77 and his pulse is 82. His respiration is 18 and oxygen saturation is 99%.  Physical Exam Vitals reviewed.  HENT:     Head: Normocephalic and atraumatic.  Eyes:     Pupils: Pupils are equal, round, and reactive to light.  Cardiovascular:     Rate and Rhythm: Normal rate and regular rhythm.     Heart  sounds: Normal heart sounds.  Pulmonary:     Effort: Pulmonary effort is normal.     Breath sounds: Normal breath sounds.  Abdominal:     General: Bowel sounds are normal.     Palpations: Abdomen is soft.  Musculoskeletal:        General: No tenderness or deformity. Normal range of motion.     Cervical back: Normal range of motion.  Lymphadenopathy:     Cervical: No cervical adenopathy.  Skin:    General: Skin is warm and dry.     Findings: No erythema or rash.  Neurological:     Mental Status: He is alert and oriented to person, place, and time.  Psychiatric:        Behavior: Behavior normal.        Thought Content: Thought content normal.        Judgment: Judgment normal.      Lab Results  Component Value Date   WBC 6.5 07/22/2020   HGB 15.8 07/22/2020   HCT 45.4 07/22/2020   MCV 95.6 07/22/2020   PLT 176 07/22/2020     Chemistry      Component Value Date/Time   NA 138 07/22/2020 0902   NA 140 01/11/2017 1033   NA 140 07/07/2016 1050   K 4.3 07/22/2020 0902   K 4.1 01/11/2017 1033   K 4.4 07/07/2016 1050   CL 100 07/22/2020 0902   CL 101 01/11/2017 1033   CO2 31 07/22/2020 0902   CO2 31 01/11/2017 1033   CO2 27 07/07/2016 1050   BUN 17 07/22/2020 0902   BUN 22 01/11/2017 1033   BUN 13.8 07/07/2016 1050   CREATININE 0.78 07/22/2020 0902   CREATININE 0.7 01/11/2017 1033   CREATININE 0.7 07/07/2016 1050      Component Value Date/Time   CALCIUM 10.0 07/22/2020 0902   CALCIUM 9.0 01/11/2017 1033   CALCIUM 9.7 07/07/2016 1050   ALKPHOS 97 07/22/2020 0902   ALKPHOS 103 (H) 01/11/2017 1033   ALKPHOS 119 07/07/2016 1050   AST 21 07/22/2020 0902   AST 17 07/07/2016 1050   ALT 25 07/22/2020 0902   ALT 22 01/11/2017 1033   ALT 18 07/07/2016 1050   BILITOT 0.5 07/22/2020 0902   BILITOT 0.68 07/07/2016 1050      Impression and Plan: Walter [name] is 75 year old gentleman with a history of a diffuse large cell non-Hodgkin's lymphoma. He's done very nicely. He  now is out from his treatments by 9 years. He completed all his treatments back in March of 2012.  He is doing pretty well from my point of view.  Again, his lower back has been a chronic problem for him.  We we  will defer to his family doctor to manage this.  I do not need any x-rays.  He does not need any additional studies.  We will plan to get him back in another 6 months.   [name], MD 3/22/202210:59 AM

## 2021-01-20 ENCOUNTER — Telehealth: Payer: Self-pay | Admitting: *Deleted

## 2021-01-20 ENCOUNTER — Other Ambulatory Visit: Payer: Self-pay

## 2021-01-20 ENCOUNTER — Encounter: Payer: Self-pay | Admitting: Hematology & Oncology

## 2021-01-20 ENCOUNTER — Inpatient Hospital Stay: Payer: Medicare Other | Attending: Hematology & Oncology

## 2021-01-20 ENCOUNTER — Inpatient Hospital Stay (HOSPITAL_BASED_OUTPATIENT_CLINIC_OR_DEPARTMENT_OTHER): Payer: Medicare Other | Admitting: Hematology & Oncology

## 2021-01-20 VITALS — BP 100/64 | HR 74 | Temp 98.2°F | Resp 18 | Wt 199.0 lb

## 2021-01-20 DIAGNOSIS — C8333 Diffuse large B-cell lymphoma, intra-abdominal lymph nodes: Secondary | ICD-10-CM

## 2021-01-20 DIAGNOSIS — Z8572 Personal history of non-Hodgkin lymphomas: Secondary | ICD-10-CM | POA: Diagnosis present

## 2021-01-20 DIAGNOSIS — Z7984 Long term (current) use of oral hypoglycemic drugs: Secondary | ICD-10-CM | POA: Diagnosis not present

## 2021-01-20 DIAGNOSIS — E119 Type 2 diabetes mellitus without complications: Secondary | ICD-10-CM | POA: Diagnosis not present

## 2021-01-20 NOTE — Progress Notes (Signed)
Hematology and Oncology Follow Up Visit  Walter [name] [id number] [birth year] 75 y.o. 01/20/2021   Principle Diagnosis:  Diffuse large cell non-Hodgkin's lymphoma-clinical remission  Current Therapy:   Observation     Interim History:  [name] is back for a six-month followup.  It has been a very quiet spring and summer for him.  He is wife really not done all that much.  He does things around the house.  He is watching what he eats.  His last hemoglobin A1c was 6.5.  He had blood work done few days ago.  Everything looks fantastic.  His blood sugar was 110.  There has been no issues with fever.  He has had no COVID issues.  He still very cautious with COVID.  There is no problems with bowels or bladder.  Back seems to be doing pretty well right now.  He has had no leg swelling.  He has had no neuropathy.  Overall, his performance status is ECOG 0.    Medications:  Current Outpatient Medications:    Ascorbic Acid (VITAMIN C) 1000 MG tablet, Take 1,000 mg by mouth daily., Disp: , Rfl:    aspirin EC 81 MG tablet, Take 81 mg by mouth daily., Disp: , Rfl:    atorvastatin (LIPITOR) 20 MG tablet, Take 20 mg by mouth daily. , Disp: , Rfl:    Cholecalciferol (VITAMIN D) 2000 UNITS tablet, Take 2,000 Units by mouth daily., Disp: , Rfl:    docusate sodium (COLACE) 100 MG capsule, Take 100 mg by mouth daily., Disp: , Rfl:    lisinopril-hydrochlorothiazide (PRINZIDE,ZESTORETIC) 10-12.5 MG tablet, Take 1 tablet by mouth daily., Disp: , Rfl:    loratadine (CLARITIN) 10 MG tablet, Take 10 mg by mouth daily. , Disp: , Rfl:    Multiple Vitamins-Minerals (MULTIVITAMIN PO), Take by mouth every morning., Disp: , Rfl:    naproxen sodium (ANAPROX) 220 MG tablet, Take 220 mg by mouth every morning. , Disp: , Rfl:    traMADol (ULTRAM) 50 MG tablet, 2 tabs daily    100 mg. in AM, Disp: , Rfl:    zinc gluconate 50 MG tablet, Take by mouth., Disp: , Rfl:    acetaminophen (TYLENOL) 500 MG tablet,  Take 1,000 mg by mouth at bedtime as needed. (Patient not taking: Reported on 01/20/2021), Disp: , Rfl:    Cysteamine Bitartrate (PROCYSBI) 300 MG PACK, Use 1 each once daily To check blood sugar, diagnosis code: E11.69 (Patient not taking: No sig reported), Disp: , Rfl:    diclofenac (VOLTAREN) 50 MG EC tablet, TAKE ONE TABLET BY MOUTH  PRN, Disp: , Rfl:    glucose blood (PRECISION QID TEST) test strip, , Disp: , Rfl:    metFORMIN (GLUCOPHAGE) 500 MG tablet, Take 1 tablet by mouth daily with breakfast., Disp: , Rfl:   Allergies:  Allergies  Allergen Reactions   Bee Venom Anaphylaxis and Other (See Comments)   Penicillins Hives and Itching   Amoxicillin Hives    Past Medical History, Surgical history, Social history, and Family History were reviewed and updated.  Review of Systems: Review of Systems  Constitutional: Negative.   HENT: Negative.    Eyes: Negative.   Respiratory: Negative.    Cardiovascular: Negative.   Gastrointestinal: Negative.   Genitourinary: Negative.   Musculoskeletal: Negative.   Skin: Negative.   Neurological: Negative.   Endo/Heme/Allergies: Negative.   Psychiatric/Behavioral: Negative.      Physical Exam:  weight is 199 lb (90.3 kg). His oral  temperature is 98.2 F (36.8 C). His blood pressure is 100/64 and his pulse is 74. His respiration is 18 and oxygen saturation is 98%.  Physical Exam Vitals reviewed.  HENT:     Head: Normocephalic and atraumatic.  Eyes:     Pupils: Pupils are equal, round, and reactive to light.  Cardiovascular:     Rate and Rhythm: Normal rate and regular rhythm.     Heart sounds: Normal heart sounds.  Pulmonary:     Effort: Pulmonary effort is normal.     Breath sounds: Normal breath sounds.  Abdominal:     General: Bowel sounds are normal.     Palpations: Abdomen is soft.  Musculoskeletal:        General: No tenderness or deformity. Normal range of motion.     Cervical back: Normal range of motion.   Lymphadenopathy:     Cervical: No cervical adenopathy.  Skin:    General: Skin is warm and dry.     Findings: No erythema or rash.  Neurological:     Mental Status: He is alert and oriented to person, place, and time.  Psychiatric:        Behavior: Behavior normal.        Thought Content: Thought content normal.        Judgment: Judgment normal.     Lab Results  Component Value Date   WBC 6.5 07/22/2020   HGB 15.8 07/22/2020   HCT 45.4 07/22/2020   MCV 95.6 07/22/2020   PLT 176 07/22/2020     Chemistry      Component Value Date/Time   NA 138 07/22/2020 0902   NA 140 01/11/2017 1033   NA 140 07/07/2016 1050   K 4.3 07/22/2020 0902   K 4.1 01/11/2017 1033   K 4.4 07/07/2016 1050   CL 100 07/22/2020 0902   CL 101 01/11/2017 1033   CO2 31 07/22/2020 0902   CO2 31 01/11/2017 1033   CO2 27 07/07/2016 1050   BUN 17 07/22/2020 0902   BUN 22 01/11/2017 1033   BUN 13.8 07/07/2016 1050   CREATININE 0.78 07/22/2020 0902   CREATININE 0.7 01/11/2017 1033   CREATININE 0.7 07/07/2016 1050      Component Value Date/Time   CALCIUM 10.0 07/22/2020 0902   CALCIUM 9.0 01/11/2017 1033   CALCIUM 9.7 07/07/2016 1050   ALKPHOS 97 07/22/2020 0902   ALKPHOS 103 (H) 01/11/2017 1033   ALKPHOS 119 07/07/2016 1050   AST 21 07/22/2020 0902   AST 17 07/07/2016 1050   ALT 25 07/22/2020 0902   ALT 22 01/11/2017 1033   ALT 18 07/07/2016 1050   BILITOT 0.5 07/22/2020 0902   BILITOT 0.68 07/07/2016 1050      Impression and Plan: Walter [name] is 75 year old gentleman with a history of a diffuse large cell non-Hodgkin's lymphoma. He's done very nicely. He now is out from his treatments by 10 years. He completed all his treatments back in March of 2012.  He is doing pretty well from my point of view.  I am glad that his diabetes is under fairly good control.  For right now, we will just plan to get him back in 6 more months.  He was comes in with his wife.     [name],  MD 9/20/20229:05 AM

## 2021-01-20 NOTE — Telephone Encounter (Signed)
Per 01/20/21 los gave upcoming appointments - confirmed - print calendar

## 2021-07-21 ENCOUNTER — Other Ambulatory Visit: Payer: Self-pay

## 2021-07-21 ENCOUNTER — Inpatient Hospital Stay: Payer: Medicare Other | Attending: Hematology & Oncology

## 2021-07-21 ENCOUNTER — Encounter: Payer: Self-pay | Admitting: Hematology & Oncology

## 2021-07-21 ENCOUNTER — Inpatient Hospital Stay (HOSPITAL_BASED_OUTPATIENT_CLINIC_OR_DEPARTMENT_OTHER): Payer: Medicare Other | Admitting: Hematology & Oncology

## 2021-07-21 VITALS — BP 112/62 | HR 97 | Temp 97.6°F | Resp 18 | Ht 69.0 in | Wt 207.0 lb

## 2021-07-21 DIAGNOSIS — C8204 Follicular lymphoma grade I, lymph nodes of axilla and upper limb: Secondary | ICD-10-CM | POA: Diagnosis not present

## 2021-07-21 DIAGNOSIS — Z7984 Long term (current) use of oral hypoglycemic drugs: Secondary | ICD-10-CM | POA: Diagnosis not present

## 2021-07-21 DIAGNOSIS — C8333 Diffuse large B-cell lymphoma, intra-abdominal lymph nodes: Secondary | ICD-10-CM

## 2021-07-21 DIAGNOSIS — E559 Vitamin D deficiency, unspecified: Secondary | ICD-10-CM | POA: Diagnosis not present

## 2021-07-21 DIAGNOSIS — E119 Type 2 diabetes mellitus without complications: Secondary | ICD-10-CM | POA: Diagnosis not present

## 2021-07-21 DIAGNOSIS — Z8572 Personal history of non-Hodgkin lymphomas: Secondary | ICD-10-CM | POA: Insufficient documentation

## 2021-07-21 LAB — CBC WITH DIFFERENTIAL (CANCER CENTER ONLY)
Abs Immature Granulocytes: 0.02 10*3/uL (ref 0.00–0.07)
Basophils Absolute: 0.1 10*3/uL (ref 0.0–0.1)
Basophils Relative: 1 %
Eosinophils Absolute: 0.3 10*3/uL (ref 0.0–0.5)
Eosinophils Relative: 4 %
HCT: 45.3 % (ref 39.0–52.0)
Hemoglobin: 15.8 g/dL (ref 13.0–17.0)
Immature Granulocytes: 0 %
Lymphocytes Relative: 44 %
Lymphs Abs: 3.2 10*3/uL (ref 0.7–4.0)
MCH: 34 pg (ref 26.0–34.0)
MCHC: 34.9 g/dL (ref 30.0–36.0)
MCV: 97.4 fL (ref 80.0–100.0)
Monocytes Absolute: 0.6 10*3/uL (ref 0.1–1.0)
Monocytes Relative: 9 %
Neutro Abs: 3.1 10*3/uL (ref 1.7–7.7)
Neutrophils Relative %: 42 %
Platelet Count: 175 10*3/uL (ref 150–400)
RBC: 4.65 MIL/uL (ref 4.22–5.81)
RDW: 12.3 % (ref 11.5–15.5)
WBC Count: 7.3 10*3/uL (ref 4.0–10.5)
nRBC: 0 % (ref 0.0–0.2)

## 2021-07-21 LAB — CMP (CANCER CENTER ONLY)
ALT: 25 U/L (ref 0–44)
AST: 20 U/L (ref 15–41)
Albumin: 4.4 g/dL (ref 3.5–5.0)
Alkaline Phosphatase: 103 U/L (ref 38–126)
Anion gap: 9 (ref 5–15)
BUN: 16 mg/dL (ref 8–23)
CO2: 30 mmol/L (ref 22–32)
Calcium: 9.8 mg/dL (ref 8.9–10.3)
Chloride: 98 mmol/L (ref 98–111)
Creatinine: 0.72 mg/dL (ref 0.61–1.24)
GFR, Estimated: >60 mL/min (ref >60)
Glucose, Bld: 159 mg/dL — ABNORMAL HIGH (ref 70–99)
Potassium: 4 mmol/L (ref 3.5–5.1)
Sodium: 137 mmol/L (ref 135–145)
Total Bilirubin: 0.6 mg/dL (ref 0.3–1.2)
Total Protein: 7.6 g/dL (ref 6.5–8.1)

## 2021-07-21 LAB — LACTATE DEHYDROGENASE: LDH: 123 U/L (ref 98–192)

## 2021-07-21 NOTE — Progress Notes (Signed)
?Hematology and Oncology Follow Up Visit ? ?Walter [name] ?[id number] ?25-May-1945 76 y.o. ?07/21/2021 ? ? 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 ?Current Therapy:   ?Observation ?    ?Interim History:  [name] is back for a six-month followup.  As always, he is doing well.  He does like to work outside.  He did not develop much with the Winter.  Now that is Spring, I am sure he will be more active. ? ?His main health problems been the diabetes.  He does see his family doctor to try to help control this.  Today, his blood sugar was 159 which is on the high side for him. ? ?He has had no problems with cough.  Is been no issues with nausea or vomiting.  He has had no change in bowel or bladder habits.  He does have some back issues which have been chronic. ? ?There is been no leg swelling.  He has had no fever.  He has had no headache.  There is been no visual changes. ? ?He did have COVID back in September 2022.  He had headache and a sore throat.  Thankfully he was not admitted. ? ?Overall, his performance status is ECOG 1.   ? ?Medications:  ?Current Outpatient Medications:  ?  Ascorbic Acid (VITAMIN C) 1000 MG tablet, Take 1,000 mg by mouth daily., Disp: , Rfl:  ?  aspirin EC 81 MG tablet, Take 81 mg by mouth daily., Disp: , Rfl:  ?  atorvastatin (LIPITOR) 20 MG tablet, Take 20 mg by mouth daily. , Disp: , Rfl:  ?  Cholecalciferol (VITAMIN D) 2000 UNITS tablet, Take 2,000 Units by mouth daily., Disp: , Rfl:  ?  docusate sodium (COLACE) 100 MG capsule, Take 100 mg by mouth daily., Disp: , Rfl:  ?  lisinopril-hydrochlorothiazide (PRINZIDE,ZESTORETIC) 10-12.5 MG tablet, Take 1 tablet by mouth daily., Disp: , Rfl:  ?  loratadine (CLARITIN) 10 MG tablet, Take 10 mg by mouth daily. , Disp: , Rfl:  ?  Multiple Vitamins-Minerals (MULTIVITAMIN PO), Take by mouth every morning., Disp: , Rfl:  ?  traMADol (ULTRAM) 50 MG tablet, 2 tabs daily    100 mg. in AM,  Disp: , Rfl:  ?  zinc gluconate 50 MG tablet, Take by mouth., Disp: , Rfl:  ?  acetaminophen (TYLENOL) 500 MG tablet, Take 1,000 mg by mouth at bedtime as needed. (Patient not taking: Reported on 01/20/2021), Disp: , Rfl:  ?  diclofenac (VOLTAREN) 50 MG EC tablet, TAKE ONE TABLET BY MOUTH  PRN, Disp: , Rfl:  ?  metFORMIN (GLUCOPHAGE) 500 MG tablet, Take 1 tablet by mouth daily with breakfast., Disp: , Rfl:  ?  naproxen sodium (ANAPROX) 220 MG tablet, Take 220 mg by mouth every morning. , Disp: , Rfl:  ? ?Allergies:  ?Allergies  ?Allergen Reactions  ? Bee Venom Anaphylaxis and Other (See Comments)  ? Penicillins Hives and Itching  ? Amoxicillin Hives  ? ? ?Past Medical History, Surgical history, Social history, and Family History were reviewed and updated. ? ?Review of Systems: ?Review of Systems  ?Constitutional: Negative.   ?HENT: Negative.    ?Eyes: Negative.   ?Respiratory: Negative.    ?Cardiovascular: Negative.   ?Gastrointestinal: Negative.   ?Genitourinary: Negative.   ?Musculoskeletal: Negative.   ?Skin: Negative.   ?Neurological: Negative.   ?Endo/Heme/Allergies: Negative.   ?Psychiatric/Behavioral: Negative.    ? ? ?Physical Exam: ? height is '5\' 9"'$  (1.753  m) and weight is 207 lb (93.9 kg). His oral temperature is 97.6 ?F (36.4 ?C). His blood pressure is 112/62 and his pulse is 97. His respiration is 18 and oxygen saturation is 98%.  ?Physical Exam ?Vitals reviewed.  ?HENT:  ?   Head: Normocephalic and atraumatic.  ?Eyes:  ?   Pupils: Pupils are equal, round, and reactive to light.  ?Cardiovascular:  ?   Rate and Rhythm: Normal rate and regular rhythm.  ?   Heart sounds: Normal heart sounds.  ?Pulmonary:  ?   Effort: Pulmonary effort is normal.  ?   Breath sounds: Normal breath sounds.  ?Abdominal:  ?   General: Bowel sounds are normal.  ?   Palpations: Abdomen is soft.  ?Musculoskeletal:     ?   General: No tenderness or deformity. Normal range of motion.  ?   Cervical back: Normal range of motion.   ?Lymphadenopathy:  ?   Cervical: No cervical adenopathy.  ?Skin: ?   General: Skin is warm and dry.  ?   Findings: No erythema or rash.  ?Neurological:  ?   Mental Status: He is alert and oriented to person, place, and time.  ?Psychiatric:     ?   Behavior: Behavior normal.     ?   Thought Content: Thought content normal.     ?   Judgment: Judgment normal.  ? ? ? ?Lab Results  ?Component Value Date  ? WBC 7.3 07/21/2021  ? HGB 15.8 07/21/2021  ? HCT 45.3 07/21/2021  ? MCV 97.4 07/21/2021  ? PLT 175 07/21/2021  ? ?  Chemistry   ?   ?Component Value Date/Time  ? NA 137 07/21/2021 0758  ? NA 140 01/11/2017 1033  ? NA 140 07/07/2016 1050  ? K 4.0 07/21/2021 0758  ? K 4.1 01/11/2017 1033  ? K 4.4 07/07/2016 1050  ? CL 98 07/21/2021 0758  ? CL 101 01/11/2017 1033  ? CO2 30 07/21/2021 0758  ? CO2 31 01/11/2017 1033  ? CO2 27 07/07/2016 1050  ? BUN 16 07/21/2021 0758  ? BUN 22 01/11/2017 1033  ? BUN 13.8 07/07/2016 1050  ? CREATININE 0.72 07/21/2021 0758  ? CREATININE 0.7 01/11/2017 1033  ? CREATININE 0.7 07/07/2016 1050  ?    ?Component Value Date/Time  ? CALCIUM 9.8 07/21/2021 0758  ? CALCIUM 9.0 01/11/2017 1033  ? CALCIUM 9.7 07/07/2016 1050  ? ALKPHOS 103 07/21/2021 0758  ? ALKPHOS 103 (H) 01/11/2017 1033  ? ALKPHOS 119 07/07/2016 1050  ? AST 20 07/21/2021 0758  ? AST 17 07/07/2016 1050  ? ALT 25 07/21/2021 0758  ? ALT 22 01/11/2017 1033  ? ALT 18 07/07/2016 1050  ? BILITOT 0.6 07/21/2021 0758  ? BILITOT 0.68 07/07/2016 1050  ?  ? ? ?Impression and Plan: ?Walter [name] is 76 year old gentleman with a history of a diffuse large cell non-Hodgkin's lymphoma. He's done very nicely. He now is out from his treatments by 10 years. He completed all his treatments back in March of 2012. ? ?Everything is going quite well.  I do not see any evidence of recurrence. ? ?We will see him back in another 6 months.  We will see him with his wife. ? ? ?[name], MD ?3/21/20239:16 AM ? ?

## 2022-01-19 ENCOUNTER — Inpatient Hospital Stay (HOSPITAL_BASED_OUTPATIENT_CLINIC_OR_DEPARTMENT_OTHER): Payer: Medicare Other | Admitting: Hematology & Oncology

## 2022-01-19 ENCOUNTER — Telehealth: Payer: Self-pay | Admitting: *Deleted

## 2022-01-19 ENCOUNTER — Inpatient Hospital Stay: Payer: Medicare Other | Attending: Hematology & Oncology

## 2022-01-19 ENCOUNTER — Encounter: Payer: Self-pay | Admitting: Hematology & Oncology

## 2022-01-19 VITALS — BP 111/75 | HR 93 | Temp 97.6°F | Resp 18 | Ht 69.0 in | Wt 201.0 lb

## 2022-01-19 DIAGNOSIS — C8353 Lymphoblastic (diffuse) lymphoma, intra-abdominal lymph nodes: Secondary | ICD-10-CM

## 2022-01-19 DIAGNOSIS — E119 Type 2 diabetes mellitus without complications: Secondary | ICD-10-CM | POA: Insufficient documentation

## 2022-01-19 DIAGNOSIS — E559 Vitamin D deficiency, unspecified: Secondary | ICD-10-CM

## 2022-01-19 DIAGNOSIS — Z8572 Personal history of non-Hodgkin lymphomas: Secondary | ICD-10-CM | POA: Diagnosis present

## 2022-01-19 DIAGNOSIS — Z79899 Other long term (current) drug therapy: Secondary | ICD-10-CM | POA: Diagnosis not present

## 2022-01-19 DIAGNOSIS — C8204 Follicular lymphoma grade I, lymph nodes of axilla and upper limb: Secondary | ICD-10-CM

## 2022-01-19 LAB — CBC WITH DIFFERENTIAL (CANCER CENTER ONLY)
Abs Immature Granulocytes: 0.02 10*3/uL (ref 0.00–0.07)
Basophils Absolute: 0.1 10*3/uL (ref 0.0–0.1)
Basophils Relative: 1 %
Eosinophils Absolute: 0.1 10*3/uL (ref 0.0–0.5)
Eosinophils Relative: 2 %
HCT: 44.7 % (ref 39.0–52.0)
Hemoglobin: 15.5 g/dL (ref 13.0–17.0)
Immature Granulocytes: 0 %
Lymphocytes Relative: 45 %
Lymphs Abs: 2.9 10*3/uL (ref 0.7–4.0)
MCH: 33.4 pg (ref 26.0–34.0)
MCHC: 34.7 g/dL (ref 30.0–36.0)
MCV: 96.3 fL (ref 80.0–100.0)
Monocytes Absolute: 0.5 10*3/uL (ref 0.1–1.0)
Monocytes Relative: 7 %
Neutro Abs: 2.9 10*3/uL (ref 1.7–7.7)
Neutrophils Relative %: 45 %
Platelet Count: 170 10*3/uL (ref 150–400)
RBC: 4.64 MIL/uL (ref 4.22–5.81)
RDW: 12.7 % (ref 11.5–15.5)
WBC Count: 6.5 10*3/uL (ref 4.0–10.5)
nRBC: 0 % (ref 0.0–0.2)

## 2022-01-19 LAB — CMP (CANCER CENTER ONLY)
ALT: 17 U/L (ref 0–44)
AST: 17 U/L (ref 15–41)
Albumin: 4.5 g/dL (ref 3.5–5.0)
Alkaline Phosphatase: 77 U/L (ref 38–126)
Anion gap: 11 (ref 5–15)
BUN: 15 mg/dL (ref 8–23)
CO2: 28 mmol/L (ref 22–32)
Calcium: 10.1 mg/dL (ref 8.9–10.3)
Chloride: 101 mmol/L (ref 98–111)
Creatinine: 0.77 mg/dL (ref 0.61–1.24)
GFR, Estimated: >60 mL/min (ref >60)
Glucose, Bld: 153 mg/dL — ABNORMAL HIGH (ref 70–99)
Potassium: 4.5 mmol/L (ref 3.5–5.1)
Sodium: 140 mmol/L (ref 135–145)
Total Bilirubin: 0.6 mg/dL (ref 0.3–1.2)
Total Protein: 7.8 g/dL (ref 6.5–8.1)

## 2022-01-19 LAB — LACTATE DEHYDROGENASE: LDH: 121 U/L (ref 98–192)

## 2022-01-19 LAB — VITAMIN D 25 HYDROXY (VIT D DEFICIENCY, FRACTURES): Vit D, 25-Hydroxy: 41.39 ng/mL (ref 30–100)

## 2022-01-19 NOTE — Progress Notes (Signed)
Hematology and Oncology Follow Up Visit  Walter [name] [id number] 1945-06-06 76 y.o. 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  Walter [name] is back for a six-month followup.  The main problem that he has had has been his back.  In July, seem to hurt his back.  He is always have some back issues.  I think his cancer was first discovered in his back with his lymphoma.  He has had no problems with bowels or bladder.  There is no pain in the legs.  There is no weakness.    His blood sugars are little on the higher side.  He has always had some problems with the diabetes.  I think he sees his family doctor in a couple weeks.  He has had no problems with cough.  He has had no bleeding.  He has had no headache.  He has had no numbness or tingling in the hands or feet.  Overall, I would say his performance status is probably ECOG 1.    Medications:  Current Outpatient Medications:    acetaminophen (TYLENOL) 500 MG tablet, Take 1,000 mg by mouth at bedtime as needed., Disp: , Rfl:    Ascorbic Acid (VITAMIN C) 1000 MG tablet, Take 1,000 mg by mouth daily., Disp: , Rfl:    aspirin EC 81 MG tablet, Take 81 mg by mouth daily., Disp: , Rfl:    atorvastatin (LIPITOR) 20 MG tablet, Take 20 mg by mouth daily. , Disp: , Rfl:    Cholecalciferol (VITAMIN D) 2000 UNITS tablet, Take 2,000 Units by mouth daily., Disp: , Rfl:    docusate sodium (COLACE) 100 MG capsule, Take 100 mg by mouth daily., Disp: , Rfl:    lisinopril-hydrochlorothiazide (PRINZIDE,ZESTORETIC) 10-12.5 MG tablet, Take 1 tablet by mouth daily., Disp: , Rfl:    loratadine (CLARITIN) 10 MG tablet, Take 10 mg by mouth daily. , Disp: , Rfl:    Multiple Vitamins-Minerals (MULTIVITAMIN PO), Take by mouth every morning., Disp: , Rfl:    traMADol (ULTRAM) 50 MG tablet, 2 tabs daily    100 mg. in AM, Disp: , Rfl:    zinc  gluconate 50 MG tablet, Take by mouth., Disp: , Rfl:    diclofenac (VOLTAREN) 50 MG EC tablet, TAKE ONE TABLET BY MOUTH  PRN, Disp: , Rfl:    metFORMIN (GLUCOPHAGE) 500 MG tablet, Take 1 tablet by mouth daily with breakfast., Disp: , Rfl:    naproxen sodium (ANAPROX) 220 MG tablet, Take 220 mg by mouth every morning. , Disp: , Rfl:   Allergies:  Allergies  Allergen Reactions   Bee Venom Anaphylaxis and Other (See Comments)   Penicillins Hives and Itching   Amoxicillin Hives    Past Medical History, Surgical history, Social history, and Family History were reviewed and updated.  Review of Systems: Review of Systems  Constitutional: Negative.   HENT: Negative.    Eyes: Negative.   Respiratory: Negative.    Cardiovascular: Negative.   Gastrointestinal: Negative.   Genitourinary: Negative.   Musculoskeletal: Negative.   Skin: Negative.   Neurological: Negative.   Endo/Heme/Allergies: Negative.   Psychiatric/Behavioral: Negative.       Physical Exam:  height is '5\' 9"'$  (1.753 m) and weight is 201 lb (91.2 kg). His oral temperature is 97.6 F (36.4 C). His blood pressure is 111/75 and his pulse is 93. His respiration is  18 and oxygen saturation is 99%.  Physical Exam Vitals reviewed.  HENT:     Head: Normocephalic and atraumatic.  Eyes:     Pupils: Pupils are equal, round, and reactive to light.  Cardiovascular:     Rate and Rhythm: Normal rate and regular rhythm.     Heart sounds: Normal heart sounds.  Pulmonary:     Effort: Pulmonary effort is normal.     Breath sounds: Normal breath sounds.  Abdominal:     General: Bowel sounds are normal.     Palpations: Abdomen is soft.  Musculoskeletal:        General: No tenderness or deformity. Normal range of motion.     Cervical back: Normal range of motion.  Lymphadenopathy:     Cervical: No cervical adenopathy.  Skin:    General: Skin is warm and dry.     Findings: No erythema or rash.  Neurological:     Mental Status:  He is alert and oriented to person, place, and time.  Psychiatric:        Behavior: Behavior normal.        Thought Content: Thought content normal.        Judgment: Judgment normal.      Lab Results  Component Value Date   WBC 6.5 01/19/2022   HGB 15.5 01/19/2022   HCT 44.7 01/19/2022   MCV 96.3 01/19/2022   PLT 170 01/19/2022     Chemistry      Component Value Date/Time   NA 140 01/19/2022 0812   NA 140 01/11/2017 1033   NA 140 07/07/2016 1050   K 4.5 01/19/2022 0812   K 4.1 01/11/2017 1033   K 4.4 07/07/2016 1050   CL 101 01/19/2022 0812   CL 101 01/11/2017 1033   CO2 28 01/19/2022 0812   CO2 31 01/11/2017 1033   CO2 27 07/07/2016 1050   BUN 15 01/19/2022 0812   BUN 22 01/11/2017 1033   BUN 13.8 07/07/2016 1050   CREATININE 0.77 01/19/2022 0812   CREATININE 0.7 01/11/2017 1033   CREATININE 0.7 07/07/2016 1050      Component Value Date/Time   CALCIUM 10.1 01/19/2022 0812   CALCIUM 9.0 01/11/2017 1033   CALCIUM 9.7 07/07/2016 1050   ALKPHOS 77 01/19/2022 0812   ALKPHOS 103 (H) 01/11/2017 1033   ALKPHOS 119 07/07/2016 1050   AST 17 01/19/2022 0812   AST 17 07/07/2016 1050   ALT 17 01/19/2022 0812   ALT 22 01/11/2017 1033   ALT 18 07/07/2016 1050   BILITOT 0.6 01/19/2022 0812   BILITOT 0.68 07/07/2016 1050      Impression and Plan: Mr. Walter [name] is 76 year old gentleman with a history of a diffuse large cell non-Hodgkin's lymphoma. He's done very nicely. He now is out from his treatments by over 11 years. He completed all his treatments back in March of 2012.  I told him that if his back continues to give him some problems, then we can see about doing an MRI.  He could certainly have a compression fracture back there that might be a little bit worse.  Currently, I do not see any problem with doing any kind of scan on him.  I do not think he needs any scans.  We will see him back in another 6 months.  We will see him with his wife.   [name],  MD 9/19/20239:13 AM

## 2022-01-19 NOTE — Telephone Encounter (Signed)
Per 01/19/22 los - gave upcoming appointment - confirmed

## 2022-06-28 ENCOUNTER — Other Ambulatory Visit: Payer: Self-pay | Admitting: Urology

## 2022-06-29 ENCOUNTER — Ambulatory Visit (HOSPITAL_COMMUNITY)
Admission: RE | Admit: 2022-06-29 | Discharge: 2022-06-29 | Disposition: A | Discharge status: Home / Self Care | Payer: Medicare Other | Source: Ambulatory Visit | Attending: Urology | Admitting: Urology

## 2022-06-29 ENCOUNTER — Inpatient Hospital Stay (HOSPITAL_COMMUNITY): Payer: Medicare Other

## 2022-06-29 ENCOUNTER — Other Ambulatory Visit: Payer: Self-pay | Admitting: Urology

## 2022-06-29 ENCOUNTER — Encounter (HOSPITAL_COMMUNITY)
Admission: RE | Disposition: A | Discharge status: Home / Self Care | Payer: Self-pay | Source: Ambulatory Visit | Attending: Urology

## 2022-06-29 ENCOUNTER — Inpatient Hospital Stay (HOSPITAL_COMMUNITY): Payer: Medicare Other | Admitting: Certified Registered"

## 2022-06-29 ENCOUNTER — Other Ambulatory Visit: Payer: Self-pay

## 2022-06-29 ENCOUNTER — Encounter (HOSPITAL_COMMUNITY): Payer: Self-pay | Admitting: Urology

## 2022-06-29 DIAGNOSIS — I10 Essential (primary) hypertension: Secondary | ICD-10-CM | POA: Insufficient documentation

## 2022-06-29 DIAGNOSIS — N201 Calculus of ureter: Principal | ICD-10-CM | POA: Insufficient documentation

## 2022-06-29 DIAGNOSIS — N135 Crossing vessel and stricture of ureter without hydronephrosis: Secondary | ICD-10-CM | POA: Insufficient documentation

## 2022-06-29 HISTORY — PX: CYSTOSCOPY WITH RETROGRADE PYELOGRAM, URETEROSCOPY AND STENT PLACEMENT: SHX5789

## 2022-06-29 LAB — CBC
HCT: 45.4 % (ref 39.0–52.0)
Hemoglobin: 15.8 g/dL (ref 13.0–17.0)
MCH: 33.4 pg (ref 26.0–34.0)
MCHC: 34.8 g/dL (ref 30.0–36.0)
MCV: 96 fL (ref 80.0–100.0)
Platelets: 177 10*3/uL (ref 150–400)
RBC: 4.73 MIL/uL (ref 4.22–5.81)
RDW: 12.4 % (ref 11.5–15.5)
WBC: 10 10*3/uL (ref 4.0–10.5)
nRBC: 0 % (ref 0.0–0.2)

## 2022-06-29 LAB — BASIC METABOLIC PANEL
Anion gap: 10 (ref 5–15)
BUN: 23 mg/dL (ref 8–23)
CO2: 23 mmol/L (ref 22–32)
Calcium: 8.8 mg/dL — ABNORMAL LOW (ref 8.9–10.3)
Chloride: 95 mmol/L — ABNORMAL LOW (ref 98–111)
Creatinine, Ser: 1.37 mg/dL — ABNORMAL HIGH (ref 0.61–1.24)
GFR, Estimated: 53 mL/min — ABNORMAL LOW (ref >60)
Glucose, Bld: 146 mg/dL — ABNORMAL HIGH (ref 70–99)
Potassium: 5.1 mmol/L (ref 3.5–5.1)
Sodium: 128 mmol/L — ABNORMAL LOW (ref 135–145)

## 2022-06-29 LAB — GLUCOSE, CAPILLARY: Glucose-Capillary: 150 mg/dL — ABNORMAL HIGH (ref 70–99)

## 2022-06-29 SURGERY — CYSTOURETEROSCOPY, WITH RETROGRADE PYELOGRAM AND STENT INSERTION
Anesthesia: General | Laterality: Right

## 2022-06-29 MED ORDER — ACETAMINOPHEN 10 MG/ML IV SOLN: 1000.0000 mg | Freq: Once | INTRAVENOUS | Status: DC | PRN

## 2022-06-29 MED ORDER — FENTANYL CITRATE PF 50 MCG/ML IJ SOSY: 25.0000 ug | PREFILLED_SYRINGE | INTRAMUSCULAR | Status: DC | PRN

## 2022-06-29 MED ORDER — IOHEXOL 300 MG/ML  SOLN
INTRAMUSCULAR | Status: DC | PRN
  Administered 2022-06-29: 10 mL

## 2022-06-29 MED ORDER — LACTATED RINGERS IV SOLN: INTRAVENOUS | Status: DC

## 2022-06-29 MED ORDER — ACETAMINOPHEN 500 MG PO TABS
1000.0000 mg | ORAL_TABLET | Freq: Once | ORAL | Status: DC
  Filled 2022-06-29: qty 2

## 2022-06-29 MED ORDER — LIDOCAINE 2% (20 MG/ML) 5 ML SYRINGE
INTRAMUSCULAR | Status: DC | PRN
  Administered 2022-06-29: 60 mg via INTRAVENOUS

## 2022-06-29 MED ORDER — PHENYLEPHRINE 80 MCG/ML (10ML) SYRINGE FOR IV PUSH (FOR BLOOD PRESSURE SUPPORT)
PREFILLED_SYRINGE | INTRAVENOUS | Status: AC
  Filled 2022-06-29: qty 10

## 2022-06-29 MED ORDER — LIDOCAINE HCL (PF) 2 % IJ SOLN
INTRAMUSCULAR | Status: AC
  Filled 2022-06-29: qty 5

## 2022-06-29 MED ORDER — AMISULPRIDE (ANTIEMETIC) 5 MG/2ML IV SOLN: 10.0000 mg | Freq: Once | INTRAVENOUS | Status: DC | PRN

## 2022-06-29 MED ORDER — ONDANSETRON HCL 4 MG/2ML IJ SOLN
INTRAMUSCULAR | Status: AC
  Filled 2022-06-29: qty 2

## 2022-06-29 MED ORDER — PHENYLEPHRINE 80 MCG/ML (10ML) SYRINGE FOR IV PUSH (FOR BLOOD PRESSURE SUPPORT)
PREFILLED_SYRINGE | INTRAVENOUS | Status: DC | PRN
  Administered 2022-06-29 (×2): 160 ug via INTRAVENOUS
  Administered 2022-06-29 (×2): 80 ug via INTRAVENOUS
  Administered 2022-06-29: 160 ug via INTRAVENOUS

## 2022-06-29 MED ORDER — PROPOFOL 10 MG/ML IV BOLUS
INTRAVENOUS | Status: DC | PRN
  Administered 2022-06-29: 200 mg via INTRAVENOUS

## 2022-06-29 MED ORDER — PHENYLEPHRINE HCL-NACL 20-0.9 MG/250ML-% IV SOLN
INTRAVENOUS | Status: DC | PRN
  Administered 2022-06-29: 50 ug/min via INTRAVENOUS

## 2022-06-29 MED ORDER — PHENYLEPHRINE HCL (PRESSORS) 10 MG/ML IV SOLN
INTRAVENOUS | Status: AC
  Filled 2022-06-29: qty 1

## 2022-06-29 MED ORDER — CIPROFLOXACIN IN D5W 400 MG/200ML IV SOLN
400.0000 mg | INTRAVENOUS | Status: AC
  Administered 2022-06-29: 400 mg via INTRAVENOUS
  Filled 2022-06-29: qty 200

## 2022-06-29 MED ORDER — PROPOFOL 10 MG/ML IV BOLUS
INTRAVENOUS | Status: AC
  Filled 2022-06-29: qty 20

## 2022-06-29 MED ORDER — CHLORHEXIDINE GLUCONATE 0.12 % MT SOLN
15.0000 mL | Freq: Once | OROMUCOSAL | Status: AC
  Administered 2022-06-29: 15 mL via OROMUCOSAL

## 2022-06-29 MED ORDER — KETOROLAC TROMETHAMINE 15 MG/ML IJ SOLN: 15.0000 mg | Freq: Once | INTRAMUSCULAR | Status: DC | PRN

## 2022-06-29 MED ORDER — FENTANYL CITRATE (PF) 100 MCG/2ML IJ SOLN
INTRAMUSCULAR | Status: AC
  Filled 2022-06-29: qty 2

## 2022-06-29 MED ORDER — EPHEDRINE SULFATE-NACL 50-0.9 MG/10ML-% IV SOSY
PREFILLED_SYRINGE | INTRAVENOUS | Status: DC | PRN
  Administered 2022-06-29: 5 mg via INTRAVENOUS

## 2022-06-29 MED ORDER — DEXAMETHASONE SODIUM PHOSPHATE 10 MG/ML IJ SOLN
INTRAMUSCULAR | Status: AC
  Filled 2022-06-29: qty 1

## 2022-06-29 MED ORDER — ONDANSETRON HCL 4 MG/2ML IJ SOLN
INTRAMUSCULAR | Status: DC | PRN
  Administered 2022-06-29: 4 mg via INTRAVENOUS

## 2022-06-29 MED ORDER — DEXAMETHASONE SODIUM PHOSPHATE 10 MG/ML IJ SOLN
INTRAMUSCULAR | Status: DC | PRN
  Administered 2022-06-29: 4 mg via INTRAVENOUS

## 2022-06-29 MED ORDER — TAMSULOSIN HCL 0.4 MG PO CAPS: 0.4000 mg | ORAL_CAPSULE | Freq: Every day | ORAL | 1 refills | Status: DC

## 2022-06-29 MED ORDER — ONDANSETRON HCL 4 MG/2ML IJ SOLN: 4.0000 mg | Freq: Once | INTRAMUSCULAR | Status: DC | PRN

## 2022-06-29 MED ORDER — FENTANYL CITRATE (PF) 100 MCG/2ML IJ SOLN
INTRAMUSCULAR | Status: DC | PRN
  Administered 2022-06-29 (×2): 25 ug via INTRAVENOUS
  Administered 2022-06-29: 50 ug via INTRAVENOUS

## 2022-06-29 MED ORDER — FENTANYL CITRATE PF 50 MCG/ML IJ SOSY
50.0000 ug | PREFILLED_SYRINGE | INTRAMUSCULAR | Status: DC
  Administered 2022-06-29: 50 ug via INTRAVENOUS
  Filled 2022-06-29: qty 1

## 2022-06-29 MED ORDER — SODIUM CHLORIDE 0.9 % IR SOLN
Status: DC | PRN
  Administered 2022-06-29: 600 mL
  Administered 2022-06-29: 1000 mL

## 2022-06-29 SURGICAL SUPPLY — 23 items
BAG URO CATCHER STRL LF (MISCELLANEOUS) ×1 IMPLANT
BASKET LASER NITINOL 1.9FR (BASKET) IMPLANT
BASKET ZERO TIP NITINOL 2.4FR (BASKET) IMPLANT
CATH URETERAL DUAL LUMEN 10F (MISCELLANEOUS) IMPLANT
CATH URETL OPEN END 6FR 70 (CATHETERS) ×1 IMPLANT
CLOTH BEACON ORANGE TIMEOUT ST (SAFETY) ×1 IMPLANT
EXTRACTOR STONE 1.7FRX115CM (UROLOGICAL SUPPLIES) IMPLANT
GLOVE BIO SURGEON STRL SZ7.5 (GLOVE) ×1 IMPLANT
GOWN STRL REUS W/ TWL XL LVL3 (GOWN DISPOSABLE) ×1 IMPLANT
GOWN STRL REUS W/TWL XL LVL3 (GOWN DISPOSABLE) ×1
GUIDEWIRE ANG ZIPWIRE 038X150 (WIRE) IMPLANT
GUIDEWIRE STR DUAL SENSOR (WIRE) ×1 IMPLANT
KIT TURNOVER KIT A (KITS) IMPLANT
LASER FIB FLEXIVA PULSE ID 365 (Laser) IMPLANT
MANIFOLD NEPTUNE II (INSTRUMENTS) ×1 IMPLANT
PACK CYSTO (CUSTOM PROCEDURE TRAY) ×1 IMPLANT
SHEATH NAVIGATOR HD 11/13X28 (SHEATH) IMPLANT
SHEATH NAVIGATOR HD 11/13X36 (SHEATH) IMPLANT
STENT URET 6FRX26 CONTOUR (STENTS) IMPLANT
TRACTIP FLEXIVA PULS ID 200XHI (Laser) IMPLANT
TRACTIP FLEXIVA PULSE ID 200 (Laser) IMPLANT
TUBING CONNECTING 10 (TUBING) ×1 IMPLANT
TUBING UROLOGY SET (TUBING) ×1 IMPLANT

## 2022-06-29 NOTE — H&P (Signed)
CC/HPI: 06/28/22: Walter [name] is a former patient of Dr. Diona Fanti with a history of stones. He had right lower quadrant pain on Saturday and then again on "Sunday. The pain was severe and he had one episode of N/V and had some initial diarrhea. He feels better today but the symptoms haven't resolved. He has had no fever. He had some urgency with small voids on Saturday. He had some dysuria. His UA has some pyuria and hematuria but he has had no gross hematuria.     ALLERGIES: Penicillin    MEDICATIONS: Aspirin  Atorvastatin Calcium  Blood Pressure Medication  Claritin  Multiple Vitamin  Stool Softener  Turmeric  Tylenol Arthritis  Vitamin C  Vitamin D     GU PSH: None     PSH Notes: Porta Cath   NON-GU PSH: None   GU PMH: Ureteral calculus (Improving), probable passed right ureteral stone - 2017, (Acute), Right distal ureteral stone-quite small. This should pass with MET, - 2017 Flank Pain - 2017    NON-GU PMH: Arthritis Hypercholesterolemia Hypertension Non-Hodgkin lymphoma, unspecified, unspecified site    FAMILY HISTORY: 1 Daughter - Runs in Family Death In The Family Father - Father Death In The Family Mother - Mother Ewing sarcoma - Son Gastric Cancer - Mother Heart Attack - Father, Brother   SOCIAL HISTORY: Marital Status: Married Preferred Language: English; Ethnicity: Not Hispanic Or Latino; Race: White Current Smoking Status: Patient has never smoked.  Does not use smokeless tobacco. Has never drank.  Does not use drugs. Patient's occupation is/was Retired.    REVIEW OF SYSTEMS:    GU Review Male:   Patient reports frequent urination and burning/ pain with urination. Patient denies hard to postpone urination, get up at night to urinate, leakage of urine, stream starts and stops, trouble starting your stream, have to strain to urinate , erection problems, and penile pain.  Gastrointestinal (Upper):   Patient reports nausea and indigestion/ heartburn. Patient denies  vomiting.  Gastrointestinal (Lower):   Patient reports diarrhea and constipation.   Constitutional:   Patient denies fever, night sweats, weight loss, and fatigue.  Skin:   Patient denies skin rash/ lesion and itching.  Eyes:   Patient denies blurred vision and double vision.  Ears/ Nose/ Throat:   Patient denies sore throat and sinus problems.  Hematologic/Lymphatic:   Patient denies swollen glands and easy bruising.  Cardiovascular:   Patient denies leg swelling and chest pains.  Respiratory:   Patient denies cough and shortness of breath.  Endocrine:   Patient denies excessive thirst.  Musculoskeletal:   Patient reports back pain and joint pain.   Neurological:   Patient reports headaches. Patient denies dizziness.  Psychologic:   Patient denies depression and anxiety.   Notes: Weak stream    VITAL SIGNS:      02"$ /26/2024 10:12 AM  Weight 193 lb / 87.54 kg  Height 70 in / 177.8 cm  BP 98/70 mmHg  Heart Rate 107 /min  Temperature 97.5 F / 36.3 C  BMI 27.7 kg/m   MULTI-SYSTEM PHYSICAL EXAMINATION:    Constitutional: Well-nourished. No physical deformities. Normally developed. Good grooming.  Respiratory: Normal breath sounds. No labored breathing, no use of accessory muscles.   Cardiovascular: Regular rate and rhythm. No murmur, no gallop.   Skin: No paleness, no jaundice, no cyanosis. No lesion, no ulcer, no rash.  Neurologic / Psychiatric: Oriented to time, oriented to place, oriented to person. No depression, no anxiety, no agitation.  Gastrointestinal: Abdominal tenderness  mild RLQ. No mass, no rigidity, non obese abdomen.   Musculoskeletal: Normal gait and station of head and neck.     Complexity of Data:  Records Review:   AUA Symptom Score, Previous Patient Records  Urine Test Review:   Urinalysis  X-Ray Review: C.T. Stone Protocol: Reviewed Films. Reviewed Report. Discussed With Patient.     PROCEDURES:         C.T. Urogram - [id number]  He has 5 right distal ureteral  stones with the largest being most superior with some obstruction. There is a 5m UVJ stone and 3-4 stones between that and the most proximal stone.       Patient confirmed No Neulasta OnPro Device.         Urinalysis w/Scope - 81001 Dipstick Dipstick Cont'd Micro  Color: Amber Bilirubin: Neg WBC/hpf: 6 - 10/hpf  Appearance: Cloudy Ketones: Trace RBC/hpf: 20 - 40/hpf  Specific Gravity: 1.025 Blood: 3+ Bacteria: Few (10-25/hpf)  pH: <=5.0 Protein: 1+ Cystals: NS (Not Seen)  Glucose: Neg Urobilinogen: 0.2 Casts: NS (Not Seen)    Nitrites: Neg Trichomonas: Not Present    Leukocyte Esterase: Trace Mucous: Present      Epithelial Cells: 0 - 5/hpf      Yeast: NS (Not Seen)      Sperm: Not Present    Notes:  Unconcentrated microscopic exam due to qns Transitional cells present     ASSESSMENT:      ICD-10 Details  1 GU:   Ureteral calculus - N20.1 Acute, Systemic Symptoms - He has multiple right distal stones with the largest at 873mwhich is the most proximal. I discussed options for treatment including MET and URS. He would like to try to pass the stones so I will send silodosin '4mg'$  since he has some issues with orthostasis and the silodosin is safer than the tamsulosin.   I will go ahead and get him set up for ureteroscopy since there is a significant probability that he won't pass all of the stones.   I have reviewed the risks of ureteroscopy including bleeding, infection, ureteral injury, need for a stent or secondary procedures, thrombotic events and anesthetic complications.    2   Flank Pain - R10.84 Acute, Systemic Symptoms  3   Microscopic hematuria - R31.1 Minor  4   Ureteral obstruction secondary to calculous - N13.2 Acute, Systemic Symptoms   PLAN:            Medications New Meds: Hydrocodone-Acetaminophen 5 mg-325 mg tablet 1 tablet PO Daily PRN   #12  0 Refill(s)  Ondansetron Odt 4 mg tablet,disintegrating 1 tablet PO Q 6 H PRN   #12  1 Refill(s)  Silodosin 4 mg  capsule 1 capsule PO Daily   #30  0 Refill(s)  [facility]  [id number]  [id number] [id number]:  [phone]:  [phone] Stop Meds: Aleve  Discontinue: 06/28/2022  - Reason: The medication cycle was completed.  Tramadol Hcl  Discontinue: 06/28/2022  - Reason: The medication cycle was completed.            Orders Labs Urine Culture  X-Rays: C.T. Stone Protocol Without I.V. Contrast  X-Ray Notes: History:  Hematuria: Yes/No  Patient to see MD after exam: Yes/No  Previous exam: CT / IVP/ US/ KUB/ None  When:  Where:  Diabetic: Yes/ No  BUN/ Creatine:  Date of last BUN Creatinine:  Weight in pounds:  Allergy- Contrasts/ Shellfish: Yes/ No  Conflicting diabetic meds: Yes/ No  Oral contrast and instructions given to patient:   Prior Authorization #: Medicare NCPR            Schedule Return Visit/Planned Activity: 1-2 Weeks - Schedule Surgery  Procedure: Approximately 1 Week - Cysto Uretero Lithotripsy [id number], right          Document Letter(s):  Created for Patient: Clinical Summary         Notes:   C: Dr. Emily Filbert.         Next Appointment:      Next Appointment: 07/09/2022 09:00 AM    Appointment Type: Surgery     Location: [facility] [phone]    Provider: Irine Seal, M.D.    Reason for Visit: OP NE CYSTO RT RTG URS HLL STENT      Signed by Irine Seal, M.D. on 06/28/22 at 5:22 PM (EST

## 2022-06-29 NOTE — Transfer of Care (Signed)
Immediate Anesthesia Transfer of Care Note  Patient: MORAN SIFUENTEZ  Procedure(s) Performed: CYSTOSCOPY WITH RETROGRADE PYELOGRAM, URETEROSCOPY, LASER LITHOTRIPSY, BASKET STONE RETRIEVAL, AND STENT PLACEMENT (Right)  Patient Location: PACU  Anesthesia Type:General  Level of Consciousness: awake, alert , oriented, and patient cooperative  Airway & Oxygen Therapy: Patient Spontanous Breathing and Patient connected to face mask oxygen  Post-op Assessment: Report given to RN and Post -op Vital signs reviewed and stable  Post vital signs: Reviewed and stable  Last Vitals:  Vitals Value Taken Time  BP 98/70 06/29/22 1708  Temp 36.4 C 06/29/22 1708  Pulse 81 06/29/22 1710  Resp 8 06/29/22 1710  SpO2 100 % 06/29/22 1710  Vitals shown include unvalidated device data.  Last Pain:  Vitals:   06/29/22 1708  TempSrc:   PainSc: Asleep         Complications: No notable events documented.

## 2022-06-29 NOTE — Anesthesia Procedure Notes (Signed)
Procedure Name: LMA Insertion Date/Time: 06/29/2022 3:33 PM  Performed by: Niel Hummer, CRNAPre-anesthesia Checklist: Emergency Drugs available, Patient identified, Suction available and Patient being monitored Patient Re-evaluated:Patient Re-evaluated prior to induction Oxygen Delivery Method: Circle system utilized Preoxygenation: Pre-oxygenation with 100% oxygen Induction Type: IV induction LMA: LMA with gastric port inserted LMA Size: 4.0 Number of attempts: 1 Dental Injury: Teeth and Oropharynx as per pre-operative assessment

## 2022-06-29 NOTE — Anesthesia Preprocedure Evaluation (Addendum)
Anesthesia Evaluation  Patient identified by MRN, date of birth, ID band Patient awake    Reviewed: Allergy & Precautions, NPO status , Patient's Chart, lab work & pertinent test results  Airway Mallampati: III  TM Distance: >3 FB Neck ROM: Full    Dental no notable dental hx.    Pulmonary neg pulmonary ROS   Pulmonary exam normal        Cardiovascular hypertension, Pt. on medications Normal cardiovascular exam     Neuro/Psych negative neurological ROS  negative psych ROS   GI/Hepatic negative GI ROS, Neg liver ROS,,,  Endo/Other  negative endocrine ROS    Renal/GU Renal disease     Musculoskeletal negative musculoskeletal ROS (+)    Abdominal   Peds  Hematology negative hematology ROS (+)   Anesthesia Other Findings Right Ureteral Stone  Reproductive/Obstetrics                             Anesthesia Physical Anesthesia Plan  ASA: 2  Anesthesia Plan: General   Post-op Pain Management:    Induction: Intravenous  PONV Risk Score and Plan: 2 and Ondansetron, Dexamethasone and Treatment may vary due to age or medical condition  Airway Management Planned: LMA  Additional Equipment:   Intra-op Plan:   Post-operative Plan: Extubation in OR  Informed Consent: I have reviewed the patients History and Physical, chart, labs and discussed the procedure including the risks, benefits and alternatives for the proposed anesthesia with the patient or authorized representative who has indicated his/her understanding and acceptance.     Dental advisory given  Plan Discussed with: CRNA  Anesthesia Plan Comments:        Anesthesia Quick Evaluation

## 2022-06-29 NOTE — Discharge Instructions (Addendum)

## 2022-06-29 NOTE — Anesthesia Postprocedure Evaluation (Signed)
Anesthesia Post Note  Patient: Walter [name]  Procedure(s) Performed: CYSTOSCOPY WITH RETROGRADE PYELOGRAM, URETEROSCOPY, LASER LITHOTRIPSY, BASKET STONE RETRIEVAL, AND STENT PLACEMENT (Right)     Patient location during evaluation: PACU Anesthesia Type: General Level of consciousness: awake and alert Pain management: pain level controlled Vital Signs Assessment: post-procedure vital signs reviewed and stable Respiratory status: spontaneous breathing, nonlabored ventilation, respiratory function stable and patient connected to nasal cannula oxygen Cardiovascular status: blood pressure returned to baseline and stable Postop Assessment: no apparent nausea or vomiting Anesthetic complications: no  No notable events documented.  Last Vitals:  Vitals:   06/29/22 1708 06/29/22 1715  BP: 98/70 105/64  Pulse:  78  Resp: 13 (!) 9  Temp: (!) 36.4 C   SpO2: 98% 99%    Last Pain:  Vitals:   06/29/22 1715  TempSrc:   PainSc: 0-No pain                 Dannell Raczkowski S

## 2022-06-29 NOTE — Op Note (Signed)
Operative Note  Preoperative diagnosis:  1.  Right ureteral calculi  Postoperative diagnosis: 1.  Right ureteral calculi  Procedure(s): 1.  Cystoscopy with right retrograde pyelogram, right ureteroscopy with laser lithotripsy with stone basketing and ureteral stent placement  Surgeon: Link Snuffer, MD  Assistants: None  Anesthesia: General  Complications: None immediate  EBL: Minimal  Specimens: 1.  None  Drains/Catheters: 1.  6 x 26 double-J ureteral stent  Intraoperative findings: 1.  Normal anterior urethra 2.  Borderline obstructing prostate that was short. 3.  Bladder mucosa without any tumors. 4.  Multiple right distal ureteral calculi and 1 of which was more proximal.  All ureteral calculi fragmented and basket extracted.  Indication: 77 year old male with multiple right ureteral calculi presents for the previously mentioned operation.  Description of procedure:  The patient was identified and consent was obtained.  The patient was taken to the operating room and placed in the supine position.  The patient was placed under general anesthesia.  Perioperative antibiotics were administered.  The patient was placed in dorsal lithotomy.  Patient was prepped and draped in a standard sterile fashion and a timeout was performed.  A 21 French rigid cystoscope was advanced into the urethra and into the bladder.  Complete cystoscopy was performed with findings noted above.  I was unable to cannulate the ureter with a wire.  Therefore I remove the scope and advanced a semirigid ureteroscope into the urethra and into the bladder.  I intubated the ureteral orifice and was able to pass a wire past the ureterovesicular junction calculus and up into the kidney.  I withdrew the scope and reintroduced the scope alongside the wire.  Multiple stones were laser fragmented followed by basket extraction.  Large amount of stone was fragmented and extracted.  I advanced the ureteroscope up the  ureter and up to the kidney.  No other ureteral calculi were seen.  I shot a retrograde pyelogram that showed moderate hydronephrosis.  No filling defect within the kidney.  I withdrew the scope visualizing the ureter upon removal.  I did not identify any other ureteral calculi.  He had significant ureteral edema in the distal ureter at the level of prior stone impaction of multiple calculi.  There was no obvious full-thickness ureteral injury.  I backloaded the wire onto the rigid cystoscope and advanced that into the bladder followed by routine placement of a 6 x 26 double-J ureteral stent.  Fluoroscopy confirmed proximal placement and direct visualization confirmed a good coil within the bladder.  I drained the bladder and withdrew the scope.  Patient tolerated the procedure well and was stable postoperatively.  Plan: Follow-up in 1 week for stent removal.

## 2022-06-30 ENCOUNTER — Encounter (HOSPITAL_COMMUNITY): Payer: Self-pay | Admitting: Urology

## 2022-07-09 ENCOUNTER — Encounter (HOSPITAL_BASED_OUTPATIENT_CLINIC_OR_DEPARTMENT_OTHER): Payer: Self-pay

## 2022-07-09 ENCOUNTER — Ambulatory Visit (HOSPITAL_BASED_OUTPATIENT_CLINIC_OR_DEPARTMENT_OTHER): Admit: 2022-07-09 | Payer: Medicare Other | Admitting: Urology

## 2022-07-09 SURGERY — CYSTOSCOPY/URETEROSCOPY/HOLMIUM LASER/STENT PLACEMENT
Anesthesia: General | Laterality: Right

## 2022-07-20 ENCOUNTER — Inpatient Hospital Stay: Payer: Medicare Other | Attending: Hematology & Oncology

## 2022-07-20 ENCOUNTER — Encounter: Payer: Self-pay | Admitting: Hematology & Oncology

## 2022-07-20 ENCOUNTER — Inpatient Hospital Stay (HOSPITAL_BASED_OUTPATIENT_CLINIC_OR_DEPARTMENT_OTHER): Payer: Medicare Other | Admitting: Hematology & Oncology

## 2022-07-20 VITALS — BP 120/75 | HR 88 | Temp 97.4°F | Resp 18 | Wt 196.4 lb

## 2022-07-20 DIAGNOSIS — Z79899 Other long term (current) drug therapy: Secondary | ICD-10-CM | POA: Insufficient documentation

## 2022-07-20 DIAGNOSIS — C8213 Follicular lymphoma grade II, intra-abdominal lymph nodes: Secondary | ICD-10-CM

## 2022-07-20 DIAGNOSIS — Z8572 Personal history of non-Hodgkin lymphomas: Secondary | ICD-10-CM | POA: Diagnosis not present

## 2022-07-20 DIAGNOSIS — C8353 Lymphoblastic (diffuse) lymphoma, intra-abdominal lymph nodes: Secondary | ICD-10-CM

## 2022-07-20 LAB — CBC WITH DIFFERENTIAL (CANCER CENTER ONLY)
Abs Immature Granulocytes: 0.01 10*3/uL (ref 0.00–0.07)
Basophils Absolute: 0.1 10*3/uL (ref 0.0–0.1)
Basophils Relative: 1 %
Eosinophils Absolute: 0.2 10*3/uL (ref 0.0–0.5)
Eosinophils Relative: 2 %
HCT: 43.9 % (ref 39.0–52.0)
Hemoglobin: 15.2 g/dL (ref 13.0–17.0)
Immature Granulocytes: 0 %
Lymphocytes Relative: 44 %
Lymphs Abs: 3.1 10*3/uL (ref 0.7–4.0)
MCH: 33.3 pg (ref 26.0–34.0)
MCHC: 34.6 g/dL (ref 30.0–36.0)
MCV: 96.1 fL (ref 80.0–100.0)
Monocytes Absolute: 0.5 10*3/uL (ref 0.1–1.0)
Monocytes Relative: 7 %
Neutro Abs: 3.2 10*3/uL (ref 1.7–7.7)
Neutrophils Relative %: 46 %
Platelet Count: 161 10*3/uL (ref 150–400)
RBC: 4.57 MIL/uL (ref 4.22–5.81)
RDW: 12.4 % (ref 11.5–15.5)
WBC Count: 7.1 10*3/uL (ref 4.0–10.5)
nRBC: 0 % (ref 0.0–0.2)

## 2022-07-20 LAB — CMP (CANCER CENTER ONLY)
ALT: 19 U/L (ref 0–44)
AST: 17 U/L (ref 15–41)
Albumin: 4.6 g/dL (ref 3.5–5.0)
Alkaline Phosphatase: 80 U/L (ref 38–126)
Anion gap: 10 (ref 5–15)
BUN: 15 mg/dL (ref 8–23)
CO2: 29 mmol/L (ref 22–32)
Calcium: 9.6 mg/dL (ref 8.9–10.3)
Chloride: 97 mmol/L — ABNORMAL LOW (ref 98–111)
Creatinine: 0.72 mg/dL (ref 0.61–1.24)
GFR, Estimated: >60 mL/min (ref >60)
Glucose, Bld: 146 mg/dL — ABNORMAL HIGH (ref 70–99)
Potassium: 3.7 mmol/L (ref 3.5–5.1)
Sodium: 136 mmol/L (ref 135–145)
Total Bilirubin: 0.7 mg/dL (ref 0.3–1.2)
Total Protein: 7.7 g/dL (ref 6.5–8.1)

## 2022-07-20 LAB — LACTATE DEHYDROGENASE: LDH: 123 U/L (ref 98–192)

## 2022-07-20 NOTE — Progress Notes (Signed)
Hematology and Oncology Follow Up Visit  Walter [name] [id number] 07-31-1945 77 y.o. 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 is back for a six-month followup.  His main problem is kidney stones.  He actually had a surgery about 3 to 4 weeks ago.  He has 6 kidney stones removed.  He feels a whole lot better.  He had scans done.  The scans obviously did not show any of any problems prior to his lymphoma.  He is doing okay otherwise.  Has occasional headaches.  He has had no problems with nausea or vomiting.  He has had no cough or shortness of breath.  He did get COVID back in November.  He really did not get all the sick from it.  He has had no rashes.  His blood sugars seem to be doing fairly well.  He has had no problems with bowels or bladder.  He has had no issues with diabetes.  Overall, I was said that his performance status is probably ECOG 1.    Medications:  Current Outpatient Medications:    acetaminophen (TYLENOL) 500 MG tablet, Take 1,000 mg by mouth in the morning and at bedtime., Disp: , Rfl:    Ascorbic Acid (VITAMIN C) 1000 MG tablet, Take 1,000 mg by mouth daily., Disp: , Rfl:    aspirin EC 81 MG tablet, Take 81 mg by mouth in the morning., Disp: , Rfl:    atorvastatin (LIPITOR) 20 MG tablet, Take 20 mg by mouth in the morning., Disp: , Rfl:    cetirizine (ZYRTEC) 10 MG tablet, Take 10 mg by mouth at bedtime., Disp: , Rfl:    Cholecalciferol (VITAMIN D3) 50 MCG (2000 UT) TABS, Take 2,000 Units by mouth daily., Disp: , Rfl:    docusate sodium (COLACE) 100 MG capsule, Take 100-200 mg by mouth See admin instructions. Take 200 mg by mouth  in the morning and 100 mg at bedtime, Disp: , Rfl:    HYDROcodone-acetaminophen (NORCO/VICODIN) 5-325 MG tablet, Take 1 tablet by mouth daily as needed (for pain)., Disp: , Rfl:     lisinopril-hydrochlorothiazide (PRINZIDE,ZESTORETIC) 10-12.5 MG tablet, Take 1 tablet by mouth in the morning., Disp: , Rfl:    Multiple Vitamins-Minerals (MULTIVITAMIN PO), Take 1 tablet by mouth daily with breakfast., Disp: , Rfl:    tamsulosin (FLOMAX) 0.4 MG CAPS capsule, Take 1 capsule (0.4 mg total) by mouth daily., Disp: 30 capsule, Rfl: 1   traMADol (ULTRAM) 50 MG tablet, Take 50 mg by mouth in the morning and at bedtime., Disp: , Rfl:    zinc gluconate 50 MG tablet, Take 50 mg by mouth daily., Disp: , Rfl:   Allergies:  Allergies  Allergen Reactions   Bee Venom Anaphylaxis and Other (See Comments)   Other Other (See Comments)    PATIENT BRUISES VERY EASILY!!!   Penicillins Hives and Itching   Amoxicillin Hives    Past Medical History, Surgical history, Social history, and Family History were reviewed and updated.  Review of Systems: Review of Systems  Constitutional: Negative.   HENT: Negative.    Eyes: Negative.   Respiratory: Negative.    Cardiovascular: Negative.   Gastrointestinal: Negative.   Genitourinary: Negative.   Musculoskeletal: Negative.   Skin: Negative.   Neurological: Negative.   Endo/Heme/Allergies: Negative.   Psychiatric/Behavioral: Negative.       Physical Exam:  weight is 196 lb 6.4 oz (89.1 kg). His oral temperature is 97.4 F (36.3 C) (abnormal). His blood pressure is 120/75 and his pulse is 88. His respiration is 18 and oxygen saturation is 98%.  Physical Exam Vitals reviewed.  HENT:     Head: Normocephalic and atraumatic.  Eyes:     Pupils: Pupils are equal, round, and reactive to light.  Cardiovascular:     Rate and Rhythm: Normal rate and regular rhythm.     Heart sounds: Normal heart sounds.  Pulmonary:     Effort: Pulmonary effort is normal.     Breath sounds: Normal breath sounds.  Abdominal:     General: Bowel sounds are normal.     Palpations: Abdomen is soft.  Musculoskeletal:        General: No tenderness or deformity.  Normal range of motion.     Cervical back: Normal range of motion.  Lymphadenopathy:     Cervical: No cervical adenopathy.  Skin:    General: Skin is warm and dry.     Findings: No erythema or rash.  Neurological:     Mental Status: He is alert and oriented to person, place, and time.  Psychiatric:        Behavior: Behavior normal.        Thought Content: Thought content normal.        Judgment: Judgment normal.      Lab Results  Component Value Date   WBC 7.1 07/20/2022   HGB 15.2 07/20/2022   HCT 43.9 07/20/2022   MCV 96.1 07/20/2022   PLT 161 07/20/2022     Chemistry      Component Value Date/Time   NA 136 07/20/2022 0752   NA 140 01/11/2017 1033   NA 140 07/07/2016 1050   K 3.7 07/20/2022 0752   K 4.1 01/11/2017 1033   K 4.4 07/07/2016 1050   CL 97 (L) 07/20/2022 0752   CL 101 01/11/2017 1033   CO2 29 07/20/2022 0752   CO2 31 01/11/2017 1033   CO2 27 07/07/2016 1050   BUN 15 07/20/2022 0752   BUN 22 01/11/2017 1033   BUN 13.8 07/07/2016 1050   CREATININE 0.72 07/20/2022 0752   CREATININE 0.7 01/11/2017 1033   CREATININE 0.7 07/07/2016 1050      Component Value Date/Time   CALCIUM 9.6 07/20/2022 0752   CALCIUM 9.0 01/11/2017 1033   CALCIUM 9.7 07/07/2016 1050   ALKPHOS 80 07/20/2022 0752   ALKPHOS 103 (H) 01/11/2017 1033   ALKPHOS 119 07/07/2016 1050   AST 17 07/20/2022 0752   AST 17 07/07/2016 1050   ALT 19 07/20/2022 0752   ALT 22 01/11/2017 1033   ALT 18 07/07/2016 1050   BILITOT 0.7 07/20/2022 0752   BILITOT 0.68 07/07/2016 1050      Impression and Plan: Walter [name] is 77 year old gentleman with a history of a diffuse large cell non-Hodgkin's lymphoma. He's done very nicely. He now is out from his treatments by 12 years. He completed all his treatments back in March of 2012.  So far, he has been doing nicely.  Again has been no evidence of lymphoma recurrence.  I had to believe that he is can be cured of his lymphoma.  Hopefully, he will not  have any other problems with kidney stones.  We will plan to get him back in another 6 months.  We always see he and his wife together.   [name], MD 3/19/20249:07 AM

## 2023-01-25 ENCOUNTER — Inpatient Hospital Stay (HOSPITAL_BASED_OUTPATIENT_CLINIC_OR_DEPARTMENT_OTHER): Payer: Medicare Other | Admitting: Hematology & Oncology

## 2023-01-25 ENCOUNTER — Inpatient Hospital Stay: Payer: Medicare Other | Attending: Hematology & Oncology

## 2023-01-25 ENCOUNTER — Encounter: Payer: Self-pay | Admitting: Hematology & Oncology

## 2023-01-25 VITALS — BP 106/68 | Temp 98.0°F | Resp 18 | Wt 194.0 lb

## 2023-01-25 DIAGNOSIS — Z8572 Personal history of non-Hodgkin lymphomas: Secondary | ICD-10-CM | POA: Insufficient documentation

## 2023-01-25 DIAGNOSIS — C8203 Follicular lymphoma grade I, intra-abdominal lymph nodes: Secondary | ICD-10-CM

## 2023-01-25 DIAGNOSIS — C8213 Follicular lymphoma grade II, intra-abdominal lymph nodes: Secondary | ICD-10-CM

## 2023-01-25 DIAGNOSIS — Z79899 Other long term (current) drug therapy: Secondary | ICD-10-CM | POA: Diagnosis not present

## 2023-01-25 DIAGNOSIS — E119 Type 2 diabetes mellitus without complications: Secondary | ICD-10-CM | POA: Insufficient documentation

## 2023-01-25 LAB — CMP (CANCER CENTER ONLY)
ALT: 21 U/L (ref 0–44)
AST: 18 U/L (ref 15–41)
Albumin: 4.5 g/dL (ref 3.5–5.0)
Alkaline Phosphatase: 77 U/L (ref 38–126)
Anion gap: 10 (ref 5–15)
BUN: 15 mg/dL (ref 8–23)
CO2: 29 mmol/L (ref 22–32)
Calcium: 9.7 mg/dL (ref 8.9–10.3)
Chloride: 99 mmol/L (ref 98–111)
Creatinine: 0.81 mg/dL (ref 0.61–1.24)
GFR, Estimated: >60 mL/min (ref >60)
Glucose, Bld: 173 mg/dL — ABNORMAL HIGH (ref 70–99)
Potassium: 4.3 mmol/L (ref 3.5–5.1)
Sodium: 138 mmol/L (ref 135–145)
Total Bilirubin: 0.7 mg/dL (ref 0.3–1.2)
Total Protein: 7.5 g/dL (ref 6.5–8.1)

## 2023-01-25 LAB — CBC WITH DIFFERENTIAL (CANCER CENTER ONLY)
Abs Immature Granulocytes: 0 10*3/uL (ref 0.00–0.07)
Basophils Absolute: 0.1 10*3/uL (ref 0.0–0.1)
Basophils Relative: 1 %
Eosinophils Absolute: 0.1 10*3/uL (ref 0.0–0.5)
Eosinophils Relative: 2 %
HCT: 45.9 % (ref 39.0–52.0)
Hemoglobin: 15.9 g/dL (ref 13.0–17.0)
Immature Granulocytes: 0 %
Lymphocytes Relative: 47 %
Lymphs Abs: 2.9 10*3/uL (ref 0.7–4.0)
MCH: 33.5 pg (ref 26.0–34.0)
MCHC: 34.6 g/dL (ref 30.0–36.0)
MCV: 96.8 fL (ref 80.0–100.0)
Monocytes Absolute: 0.4 10*3/uL (ref 0.1–1.0)
Monocytes Relative: 7 %
Neutro Abs: 2.7 10*3/uL (ref 1.7–7.7)
Neutrophils Relative %: 43 %
Platelet Count: 178 10*3/uL (ref 150–400)
RBC: 4.74 MIL/uL (ref 4.22–5.81)
RDW: 12.8 % (ref 11.5–15.5)
WBC Count: 6.2 10*3/uL (ref 4.0–10.5)
nRBC: 0 % (ref 0.0–0.2)

## 2023-01-25 LAB — LACTATE DEHYDROGENASE: LDH: 117 U/L (ref 98–192)

## 2023-01-25 NOTE — Progress Notes (Signed)
Hematology and Oncology Follow Up Visit  Walter [name] [id number] 12/20/1945 77 y.o. 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  He is doing pretty well.  His blood sugars still are on the higher side.  Thankfully, he has had no problems with kidney stones.  I think he had surgery back in February.  He has had no problems with worsening back pain.  He has lower back issues.  This is not from lymphoma.  He has had no fever.  Thankfully, there has been no problems with COVID.  Has had no change in bowel or bladder habits.  He has had no nausea or vomiting.  He has had no rashes.  He has had no leg swelling.  He has had no bleeding.  Overall, I would say that his performance status is probably ECOG 1.  Medications:  Current Outpatient Medications:    acetaminophen (TYLENOL) 500 MG tablet, Take 1,000 mg by mouth in the morning and at bedtime., Disp: , Rfl:    Ascorbic Acid (VITAMIN C) 1000 MG tablet, Take 1,000 mg by mouth daily., Disp: , Rfl:    aspirin EC 81 MG tablet, Take 81 mg by mouth in the morning., Disp: , Rfl:    atorvastatin (LIPITOR) 20 MG tablet, Take 20 mg by mouth in the morning., Disp: , Rfl:    cetirizine (ZYRTEC) 10 MG tablet, Take 10 mg by mouth at bedtime., Disp: , Rfl:    Cholecalciferol (VITAMIN D3) 50 MCG (2000 UT) TABS, Take 2,000 Units by mouth daily., Disp: , Rfl:    docusate sodium (COLACE) 100 MG capsule, Take 100-200 mg by mouth See admin instructions. Take 200 mg by mouth  in the morning and 100 mg at bedtime, Disp: , Rfl:    lisinopril-hydrochlorothiazide (PRINZIDE,ZESTORETIC) 10-12.5 MG tablet, Take 1 tablet by mouth in the morning., Disp: , Rfl:    Multiple Vitamins-Minerals (MULTIVITAMIN PO), Take 1 tablet by mouth daily with breakfast., Disp: , Rfl:    traMADol (ULTRAM) 50 MG tablet,  Take 50 mg by mouth in the morning and at bedtime., Disp: , Rfl:    zinc gluconate 50 MG tablet, Take 50 mg by mouth daily., Disp: , Rfl:    HYDROcodone-acetaminophen (NORCO/VICODIN) 5-325 MG tablet, Take 1 tablet by mouth daily as needed (for pain). (Patient not taking: Reported on 01/25/2023), Disp: , Rfl:    tamsulosin (FLOMAX) 0.4 MG CAPS capsule, Take 1 capsule (0.4 mg total) by mouth daily. (Patient not taking: Reported on 01/25/2023), Disp: 30 capsule, Rfl: 1  Allergies:  Allergies  Allergen Reactions   Bee Venom Anaphylaxis and Other (See Comments)   Other Other (See Comments)    PATIENT BRUISES VERY EASILY!!!   Penicillins Hives and Itching   Amoxicillin Hives    Past Medical History, Surgical history, Social history, and Family History were reviewed and updated.  Review of Systems: Review of Systems  Constitutional: Negative.   HENT: Negative.    Eyes: Negative.   Respiratory: Negative.    Cardiovascular: Negative.   Gastrointestinal: Negative.   Genitourinary: Negative.   Musculoskeletal: Negative.   Skin: Negative.   Neurological: Negative.   Endo/Heme/Allergies: Negative.   Psychiatric/Behavioral: Negative.       Physical Exam:  weight is 194 lb 0.6 oz (88 kg). His oral temperature is 98 F (36.7 C).  His blood pressure is 106/68. His respiration is 18 and oxygen saturation is 97%.  Physical Exam Vitals reviewed.  HENT:     Head: Normocephalic and atraumatic.  Eyes:     Pupils: Pupils are equal, round, and reactive to light.  Cardiovascular:     Rate and Rhythm: Normal rate and regular rhythm.     Heart sounds: Normal heart sounds.  Pulmonary:     Effort: Pulmonary effort is normal.     Breath sounds: Normal breath sounds.  Abdominal:     General: Bowel sounds are normal.     Palpations: Abdomen is soft.  Musculoskeletal:        General: No tenderness or deformity. Normal range of motion.     Cervical back: Normal range of motion.  Lymphadenopathy:      Cervical: No cervical adenopathy.  Skin:    General: Skin is warm and dry.     Findings: No erythema or rash.  Neurological:     Mental Status: He is alert and oriented to person, place, and time.  Psychiatric:        Behavior: Behavior normal.        Thought Content: Thought content normal.        Judgment: Judgment normal.     Lab Results  Component Value Date   WBC 6.2 01/25/2023   HGB 15.9 01/25/2023   HCT 45.9 01/25/2023   MCV 96.8 01/25/2023   PLT 178 01/25/2023     Chemistry      Component Value Date/Time   NA 138 01/25/2023 0828   NA 140 01/11/2017 1033   NA 140 07/07/2016 1050   K 4.3 01/25/2023 0828   K 4.1 01/11/2017 1033   K 4.4 07/07/2016 1050   CL 99 01/25/2023 0828   CL 101 01/11/2017 1033   CO2 29 01/25/2023 0828   CO2 31 01/11/2017 1033   CO2 27 07/07/2016 1050   BUN 15 01/25/2023 0828   BUN 22 01/11/2017 1033   BUN 13.8 07/07/2016 1050   CREATININE 0.81 01/25/2023 0828   CREATININE 0.7 01/11/2017 1033   CREATININE 0.7 07/07/2016 1050      Component Value Date/Time   CALCIUM 9.7 01/25/2023 0828   CALCIUM 9.0 01/11/2017 1033   CALCIUM 9.7 07/07/2016 1050   ALKPHOS 77 01/25/2023 0828   ALKPHOS 103 (H) 01/11/2017 1033   ALKPHOS 119 07/07/2016 1050   AST 18 01/25/2023 0828   AST 17 07/07/2016 1050   ALT 21 01/25/2023 0828   ALT 22 01/11/2017 1033   ALT 18 07/07/2016 1050   BILITOT 0.7 01/25/2023 0828   BILITOT 0.68 07/07/2016 1050      Impression and Plan: Mr. [name] is 77 year old gentleman with a history of a diffuse large cell non-Hodgkin's lymphoma. He's done very nicely. He now is out from his treatments by 12 years. He completed all his treatments back in March of 2012.  So far, he has been doing nicely.  Again has been no evidence of lymphoma recurrence.  I have to believe that he is cured of his lymphoma.  Hopefully, he will be able to have his diabetes under good control.  I know he sees his family doctor for this.  We will go  ahead and plan for another follow-up in 6 months.  I do not see any need for any radiologic studies.   Josph Macho, MD 9/24/20249:54 AM

## 2023-07-26 ENCOUNTER — Inpatient Hospital Stay: Payer: Medicare Other | Attending: Hematology & Oncology

## 2023-07-26 ENCOUNTER — Encounter: Payer: Self-pay | Admitting: Hematology & Oncology

## 2023-07-26 ENCOUNTER — Inpatient Hospital Stay (HOSPITAL_BASED_OUTPATIENT_CLINIC_OR_DEPARTMENT_OTHER): Payer: Medicare Other | Admitting: Hematology & Oncology

## 2023-07-26 VITALS — BP 113/69 | HR 93 | Temp 97.8°F | Resp 20 | Ht 71.0 in | Wt 199.4 lb

## 2023-07-26 DIAGNOSIS — Z79899 Other long term (current) drug therapy: Secondary | ICD-10-CM | POA: Insufficient documentation

## 2023-07-26 DIAGNOSIS — C833A Diffuse large b-cell lymphoma, in remission: Secondary | ICD-10-CM | POA: Insufficient documentation

## 2023-07-26 DIAGNOSIS — E0843 Diabetes mellitus due to underlying condition with diabetic autonomic (poly)neuropathy: Secondary | ICD-10-CM | POA: Diagnosis not present

## 2023-07-26 DIAGNOSIS — E119 Type 2 diabetes mellitus without complications: Secondary | ICD-10-CM | POA: Insufficient documentation

## 2023-07-26 DIAGNOSIS — C8338 Diffuse large B-cell lymphoma, lymph nodes of multiple sites: Secondary | ICD-10-CM

## 2023-07-26 DIAGNOSIS — C8203 Follicular lymphoma grade I, intra-abdominal lymph nodes: Secondary | ICD-10-CM

## 2023-07-26 LAB — CBC WITH DIFFERENTIAL (CANCER CENTER ONLY)
Abs Immature Granulocytes: 0 10*3/uL (ref 0.00–0.07)
Band Neutrophils: 0 %
Basophils Absolute: 0 10*3/uL (ref 0.0–0.1)
Basophils Relative: 0 %
Blasts: 0 %
Eosinophils Absolute: 0.2 10*3/uL (ref 0.0–0.5)
Eosinophils Relative: 3 %
HCT: 46.2 % (ref 39.0–52.0)
Hemoglobin: 16.1 g/dL (ref 13.0–17.0)
Immature Granulocytes: 0 %
Lymphocytes Relative: 41 %
Lymphs Abs: 2.9 10*3/uL (ref 0.7–4.0)
MCH: 33.5 pg (ref 26.0–34.0)
MCHC: 34.8 g/dL (ref 30.0–36.0)
MCV: 96.3 fL (ref 80.0–100.0)
Metamyelocytes Relative: 0 %
Monocytes Absolute: 0.1 10*3/uL (ref 0.1–1.0)
Monocytes Relative: 2 %
Myelocytes: 0 %
Neutro Abs: 3.9 10*3/uL (ref 1.7–7.7)
Neutrophils Relative %: 54 %
Other: 0 %
Platelet Count: 164 10*3/uL (ref 150–400)
Promyelocytes Relative: 0 %
RBC: 4.8 MIL/uL (ref 4.22–5.81)
RDW: 12.9 % (ref 11.5–15.5)
WBC Count: 7.1 10*3/uL (ref 4.0–10.5)
nRBC: 0 % (ref 0.0–0.2)
nRBC: 0 /100{WBCs}

## 2023-07-26 LAB — CMP (CANCER CENTER ONLY)
ALT: 17 U/L (ref 0–44)
AST: 17 U/L (ref 15–41)
Albumin: 4.6 g/dL (ref 3.5–5.0)
Alkaline Phosphatase: 83 U/L (ref 38–126)
Anion gap: 12 (ref 5–15)
BUN: 15 mg/dL (ref 8–23)
CO2: 28 mmol/L (ref 22–32)
Calcium: 9.6 mg/dL (ref 8.9–10.3)
Chloride: 101 mmol/L (ref 98–111)
Creatinine: 0.93 mg/dL (ref 0.61–1.24)
GFR, Estimated: >60 mL/min (ref >60)
Glucose, Bld: 147 mg/dL — ABNORMAL HIGH (ref 70–99)
Potassium: 4.5 mmol/L (ref 3.5–5.1)
Sodium: 141 mmol/L (ref 135–145)
Total Bilirubin: 0.6 mg/dL (ref 0.0–1.2)
Total Protein: 7.2 g/dL (ref 6.5–8.1)

## 2023-07-26 LAB — LACTATE DEHYDROGENASE: LDH: 118 U/L (ref 98–192)

## 2023-07-26 NOTE — Progress Notes (Signed)
 Hematology and Oncology Follow Up Visit  Walter [name] [id number] Aug 08, 1945 78 y.o. 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 is back for a six-month followup.  He is doing pretty well.  He really has no complaints since we last saw him.  He is on glipizide now for his blood sugars.  I am sure that this will help with his diabetes.  His back still bothers him.  However, he does take some time whenever he has to do any work outside the house.  He has had a good appetite.  He has had no change in bowel or bladder habits.  Thankfully, he has had no problems with kidney stones.  He has had no fever.  There is been no problems with COVID or Influenza.  He has had no rashes.  There is been no bleeding.  He has had no mouth sores.  There is been no visual changes.  Overall, his performance status is ECOG 1.    Medications:  Current Outpatient Medications:    acetaminophen (TYLENOL) 500 MG tablet, Take 1,000 mg by mouth in the morning and at bedtime., Disp: , Rfl:    Ascorbic Acid (VITAMIN C) 1000 MG tablet, Take 1,000 mg by mouth daily., Disp: , Rfl:    aspirin EC 81 MG tablet, Take 81 mg by mouth in the morning., Disp: , Rfl:    atorvastatin (LIPITOR) 20 MG tablet, Take 20 mg by mouth in the morning., Disp: , Rfl:    cetirizine (ZYRTEC) 10 MG tablet, Take 10 mg by mouth at bedtime., Disp: , Rfl:    Cholecalciferol (VITAMIN D3) 50 MCG (2000 UT) TABS, Take 2,000 Units by mouth daily., Disp: , Rfl:    Cyanocobalamin (VITAMIN B 12 PO), Take by mouth daily., Disp: , Rfl:    docusate sodium (COLACE) 100 MG capsule, Take 100-200 mg by mouth See admin instructions. Take 200 mg by mouth  in the morning and 100 mg at bedtime, Disp: , Rfl:    glipiZIDE (GLUCOTROL XL) 5 MG 24 hr tablet, Take 5 mg by mouth daily., Disp: , Rfl:    lisinopril-hydrochlorothiazide  (PRINZIDE,ZESTORETIC) 10-12.5 MG tablet, Take 1 tablet by mouth in the morning., Disp: , Rfl:    Multiple Vitamins-Minerals (MULTIVITAMIN PO), Take 1 tablet by mouth daily with breakfast., Disp: , Rfl:    traMADol (ULTRAM) 50 MG tablet, Take 50 mg by mouth in the morning and at bedtime., Disp: , Rfl:    zinc gluconate 50 MG tablet, Take 50 mg by mouth daily., Disp: , Rfl:   Allergies:  Allergies  Allergen Reactions   Bee Venom Anaphylaxis and Other (See Comments)   Other Other (See Comments)    PATIENT BRUISES VERY EASILY!!!   Penicillins Hives and Itching   Amoxicillin Hives    Past Medical History, Surgical history, Social history, and Family History were reviewed and updated.  Review of Systems: Review of Systems  Constitutional: Negative.   HENT: Negative.    Eyes: Negative.   Respiratory: Negative.    Cardiovascular: Negative.   Gastrointestinal: Negative.   Genitourinary: Negative.   Musculoskeletal: Negative.   Skin: Negative.   Neurological: Negative.   Endo/Heme/Allergies: Negative.   Psychiatric/Behavioral: Negative.       Physical Exam:  height is 5\' 11"  (1.803 m) and weight is 199 lb 6.4 oz (90.4 kg). His oral  temperature is 97.8 F (36.6 C). His blood pressure is 113/69 and his pulse is 93. His respiration is 20 and oxygen saturation is 98%.  Physical Exam Vitals reviewed.  HENT:     Head: Normocephalic and atraumatic.  Eyes:     Pupils: Pupils are equal, round, and reactive to light.  Cardiovascular:     Rate and Rhythm: Normal rate and regular rhythm.     Heart sounds: Normal heart sounds.  Pulmonary:     Effort: Pulmonary effort is normal.     Breath sounds: Normal breath sounds.  Abdominal:     General: Bowel sounds are normal.     Palpations: Abdomen is soft.  Musculoskeletal:        General: No tenderness or deformity. Normal range of motion.     Cervical back: Normal range of motion.  Lymphadenopathy:     Cervical: No cervical adenopathy.   Skin:    General: Skin is warm and dry.     Findings: No erythema or rash.  Neurological:     Mental Status: He is alert and oriented to person, place, and time.  Psychiatric:        Behavior: Behavior normal.        Thought Content: Thought content normal.        Judgment: Judgment normal.      Lab Results  Component Value Date   WBC 7.1 07/26/2023   HGB 16.1 07/26/2023   HCT 46.2 07/26/2023   MCV 96.3 07/26/2023   PLT 164 07/26/2023     Chemistry      Component Value Date/Time   NA 141 07/26/2023 0903   NA 140 01/11/2017 1033   NA 140 07/07/2016 1050   K 4.5 07/26/2023 0903   K 4.1 01/11/2017 1033   K 4.4 07/07/2016 1050   CL 101 07/26/2023 0903   CL 101 01/11/2017 1033   CO2 28 07/26/2023 0903   CO2 31 01/11/2017 1033   CO2 27 07/07/2016 1050   BUN 15 07/26/2023 0903   BUN 22 01/11/2017 1033   BUN 13.8 07/07/2016 1050   CREATININE 0.93 07/26/2023 0903   CREATININE 0.7 01/11/2017 1033   CREATININE 0.7 07/07/2016 1050      Component Value Date/Time   CALCIUM 9.6 07/26/2023 0903   CALCIUM 9.0 01/11/2017 1033   CALCIUM 9.7 07/07/2016 1050   ALKPHOS 83 07/26/2023 0903   ALKPHOS 103 (H) 01/11/2017 1033   ALKPHOS 119 07/07/2016 1050   AST 17 07/26/2023 0903   AST 17 07/07/2016 1050   ALT 17 07/26/2023 0903   ALT 22 01/11/2017 1033   ALT 18 07/07/2016 1050   BILITOT 0.6 07/26/2023 0903   BILITOT 0.68 07/07/2016 1050      Impression and Plan: Walter [name] is 78 year old gentleman with a history of a diffuse large cell non-Hodgkin's lymphoma. He's done very nicely. He now is out from his treatments by 13 years. He completed all his treatments back in March of 2012.  So far, he has been doing nicely.  Again has been no evidence of lymphoma recurrence.  I have to believe that he is cured of his lymphoma.  Hopefully, he will be able to have his diabetes under good control.  I know he sees his family doctor for this.  We will go ahead and plan for another  follow-up in 6 months.  I do not see any need for any radiologic studies.   Josph Macho, MD 3/25/202510:13 AM

## 2024-01-24 ENCOUNTER — Ambulatory Visit: Payer: Self-pay | Admitting: Hematology & Oncology

## 2024-01-24 ENCOUNTER — Inpatient Hospital Stay: Attending: Hematology & Oncology

## 2024-01-24 ENCOUNTER — Inpatient Hospital Stay (HOSPITAL_BASED_OUTPATIENT_CLINIC_OR_DEPARTMENT_OTHER): Admitting: Hematology & Oncology

## 2024-01-24 ENCOUNTER — Encounter: Payer: Self-pay | Admitting: Hematology & Oncology

## 2024-01-24 VITALS — BP 109/72 | HR 88 | Temp 97.9°F | Resp 18 | Ht 71.0 in | Wt 191.0 lb

## 2024-01-24 DIAGNOSIS — Z79891 Long term (current) use of opiate analgesic: Secondary | ICD-10-CM | POA: Insufficient documentation

## 2024-01-24 DIAGNOSIS — R748 Abnormal levels of other serum enzymes: Secondary | ICD-10-CM | POA: Insufficient documentation

## 2024-01-24 DIAGNOSIS — C833A Diffuse large b-cell lymphoma, in remission: Secondary | ICD-10-CM | POA: Insufficient documentation

## 2024-01-24 DIAGNOSIS — Z79899 Other long term (current) drug therapy: Secondary | ICD-10-CM | POA: Insufficient documentation

## 2024-01-24 DIAGNOSIS — E0843 Diabetes mellitus due to underlying condition with diabetic autonomic (poly)neuropathy: Secondary | ICD-10-CM

## 2024-01-24 DIAGNOSIS — C8338 Diffuse large B-cell lymphoma, lymph nodes of multiple sites: Secondary | ICD-10-CM

## 2024-01-24 LAB — LACTATE DEHYDROGENASE: LDH: 144 U/L (ref 98–192)

## 2024-01-24 LAB — CBC WITH DIFFERENTIAL (CANCER CENTER ONLY)
Abs Immature Granulocytes: 0.01 K/uL (ref 0.00–0.07)
Basophils Absolute: 0.1 K/uL (ref 0.0–0.1)
Basophils Relative: 1 %
Eosinophils Absolute: 0.1 K/uL (ref 0.0–0.5)
Eosinophils Relative: 2 %
HCT: 45.6 % (ref 39.0–52.0)
Hemoglobin: 15.7 g/dL (ref 13.0–17.0)
Immature Granulocytes: 0 %
Lymphocytes Relative: 38 %
Lymphs Abs: 2.5 K/uL (ref 0.7–4.0)
MCH: 33.1 pg (ref 26.0–34.0)
MCHC: 34.4 g/dL (ref 30.0–36.0)
MCV: 96.2 fL (ref 80.0–100.0)
Monocytes Absolute: 0.5 K/uL (ref 0.1–1.0)
Monocytes Relative: 8 %
Neutro Abs: 3.4 K/uL (ref 1.7–7.7)
Neutrophils Relative %: 51 %
Platelet Count: 187 K/uL (ref 150–400)
RBC: 4.74 MIL/uL (ref 4.22–5.81)
RDW: 13.1 % (ref 11.5–15.5)
WBC Count: 6.5 K/uL (ref 4.0–10.5)
nRBC: 0 % (ref 0.0–0.2)

## 2024-01-24 LAB — CMP (CANCER CENTER ONLY)
ALT: 18 U/L (ref 0–44)
AST: 20 U/L (ref 15–41)
Albumin: 4.3 g/dL (ref 3.5–5.0)
Alkaline Phosphatase: 157 U/L — ABNORMAL HIGH (ref 38–126)
Anion gap: 14 (ref 5–15)
BUN: 15 mg/dL (ref 8–23)
CO2: 25 mmol/L (ref 22–32)
Calcium: 9.7 mg/dL (ref 8.9–10.3)
Chloride: 102 mmol/L (ref 98–111)
Creatinine: 0.75 mg/dL (ref 0.61–1.24)
GFR, Estimated: >60 mL/min
Glucose, Bld: 120 mg/dL — ABNORMAL HIGH (ref 70–99)
Potassium: 4.3 mmol/L (ref 3.5–5.1)
Sodium: 141 mmol/L (ref 135–145)
Total Bilirubin: 0.4 mg/dL (ref 0.0–1.2)
Total Protein: 7.6 g/dL (ref 6.5–8.1)

## 2024-01-24 LAB — HEMOGLOBIN A1C
Hgb A1c MFr Bld: 6 % — ABNORMAL HIGH (ref 4.8–5.6)
Mean Plasma Glucose: 125.5 mg/dL

## 2024-01-24 NOTE — Telephone Encounter (Signed)
-----   Message from Maude JONELLE Crease sent at 01/24/2024  2:15 PM EDT ----- Please call and let him know that the hemoglobin A1c is only 6.  Jeralyn ----- Message ----- From: Rebecka, [facility] [place] Sent: 01/24/2024   9:09 AM EDT To: Maude JONELLE Crease, MD

## 2024-01-24 NOTE — Telephone Encounter (Signed)
 Advised via MyChart.

## 2024-01-24 NOTE — Progress Notes (Signed)
 Hematology and Oncology Follow Up Visit  Walter Walter [name] [id number] 27-Sep-1945 78 y.o. 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  He is doing pretty well.  He really has no complaints since we last saw him.  He is on glipizide now for his blood sugars.  His blood sugars have been trending downward.  He still has a little bit of back discomfort.  He has had this for quite a while.  However, he is still able to do things.  He does has a hard time bending over.  He has had no problems with fever.  There is been no issues with COVID.  He has had no change in bowel or bladder habits.  He has had no leg swelling.  He has had no rashes.  He has had no bleeding.  He has had no cough.  There is been no issues with headaches.  Overall, I would have to say that his performance status is probably ECOG 1.    Medications:  Current Outpatient Medications:    acetaminophen  (TYLENOL ) 500 MG tablet, Take 1,000 mg by mouth in the morning and at bedtime., Disp: , Rfl:    Ascorbic Acid (VITAMIN C) 1000 MG tablet, Take 1,000 mg by mouth daily., Disp: , Rfl:    aspirin EC 81 MG tablet, Take 81 mg by mouth in the morning., Disp: , Rfl:    atorvastatin (LIPITOR) 20 MG tablet, Take 20 mg by mouth in the morning., Disp: , Rfl:    cetirizine (ZYRTEC) 10 MG tablet, Take 10 mg by mouth at bedtime., Disp: , Rfl:    Cholecalciferol (VITAMIN D3) 50 MCG (2000 UT) TABS, Take 2,000 Units by mouth daily., Disp: , Rfl:    Cyanocobalamin (VITAMIN B 12 PO), Take by mouth daily., Disp: , Rfl:    docusate sodium (COLACE) 100 MG capsule, Take 100-200 mg by mouth See admin instructions. Take 200 mg by mouth  in the morning and 100 mg at bedtime, Disp: , Rfl:    glipiZIDE (GLUCOTROL XL) 5 MG 24 hr tablet, Take 5 mg by mouth daily., Disp: , Rfl:     lisinopril-hydrochlorothiazide (PRINZIDE,ZESTORETIC) 10-12.5 MG tablet, Take 1 tablet by mouth in the morning., Disp: , Rfl:    Multiple Vitamins-Minerals (MULTIVITAMIN PO), Take 1 tablet by mouth daily with breakfast., Disp: , Rfl:    traMADol (ULTRAM) 50 MG tablet, Take 50 mg by mouth in the morning and at bedtime., Disp: , Rfl:    zinc gluconate 50 MG tablet, Take 50 mg by mouth daily., Disp: , Rfl:   Allergies:  Allergies  Allergen Reactions   Walter [name] Venom Anaphylaxis and Other (See Comments)   Other Other (See Comments)    PATIENT BRUISES VERY EASILY!!!   Penicillins Hives and Itching   Amoxicillin Hives    Past Medical History, Surgical history, Social history, and Family History were reviewed and updated.  Review of Systems: Review of Systems  Constitutional: Negative.   HENT: Negative.    Eyes: Negative.   Respiratory: Negative.    Cardiovascular: Negative.   Gastrointestinal: Negative.   Genitourinary: Negative.   Musculoskeletal: Negative.   Skin: Negative.   Neurological: Negative.   Endo/Heme/Allergies: Negative.   Psychiatric/Behavioral: Negative.       Physical Exam:  height is 5' 11 (1.803 m) and weight is  191 lb (86.6 kg). His oral temperature is 97.9 F (36.6 C). His blood pressure is 109/72 and his pulse is 88. His respiration is 18 and oxygen saturation is 98%.  Physical Exam Vitals reviewed.  HENT:     Head: Normocephalic and atraumatic.  Eyes:     Pupils: Pupils are equal, round, and reactive to light.  Cardiovascular:     Rate and Rhythm: Normal rate and regular rhythm.     Heart sounds: Normal heart sounds.  Pulmonary:     Effort: Pulmonary effort is normal.     Breath sounds: Normal breath sounds.  Abdominal:     General: Bowel sounds are normal.     Palpations: Abdomen is soft.  Musculoskeletal:        General: No tenderness or deformity. Normal range of motion.     Cervical back: Normal range of motion.  Lymphadenopathy:     Cervical: No  cervical adenopathy.  Skin:    General: Skin is warm and dry.     Findings: No erythema or rash.  Neurological:     Mental Status: He is alert and oriented to person, place, and time.  Psychiatric:        Behavior: Behavior normal.        Thought Content: Thought content normal.        Judgment: Judgment normal.      Lab Results  Component Value Date   WBC 6.5 01/24/2024   HGB 15.7 01/24/2024   HCT 45.6 01/24/2024   MCV 96.2 01/24/2024   PLT 187 01/24/2024     Chemistry      Component Value Date/Time   NA 141 01/24/2024 0903   NA 140 01/11/2017 1033   NA 140 07/07/2016 1050   K 4.3 01/24/2024 0903   K 4.1 01/11/2017 1033   K 4.4 07/07/2016 1050   CL 102 01/24/2024 0903   CL 101 01/11/2017 1033   CO2 25 01/24/2024 0903   CO2 31 01/11/2017 1033   CO2 27 07/07/2016 1050   BUN 15 01/24/2024 0903   BUN 22 01/11/2017 1033   BUN 13.8 07/07/2016 1050   CREATININE 0.75 01/24/2024 0903   CREATININE 0.7 01/11/2017 1033   CREATININE 0.7 07/07/2016 1050      Component Value Date/Time   CALCIUM 9.7 01/24/2024 0903   CALCIUM 9.0 01/11/2017 1033   CALCIUM 9.7 07/07/2016 1050   ALKPHOS 157 (H) 01/24/2024 0903   ALKPHOS 103 (H) 01/11/2017 1033   ALKPHOS 119 07/07/2016 1050   AST 20 01/24/2024 0903   AST 17 07/07/2016 1050   ALT 18 01/24/2024 0903   ALT 22 01/11/2017 1033   ALT 18 07/07/2016 1050   BILITOT 0.4 01/24/2024 0903   BILITOT 0.68 07/07/2016 1050      Impression and Plan: Mr. Walter [name] is 78 year old gentleman with a history of a diffuse large cell non-Hodgkin's lymphoma. He's done very nicely. He now is out from his treatments by 13.5 years. He completed all his treatments back in March of 2012.  So far, he has been doing nicely.  Again has been no evidence of lymphoma recurrence.  I have to believe that he is cured of his lymphoma.  I noted that the alkaline phosphatase was up a little bit.  I am unsure exactly what this indicates.  I think some that we just  have to watch since he really has had no symptoms.  Will go ahead and plan to get him back in another 6  months.    Maude JONELLE Crease, MD 9/23/202511:49 AM

## 2024-07-24 ENCOUNTER — Ambulatory Visit: Admitting: Hematology & Oncology

## 2024-07-24 ENCOUNTER — Other Ambulatory Visit
